# Patient Record
Sex: Male | Born: 1945 | Race: Black or African American | Hispanic: No | Marital: Single | Smoking: Never smoker
Health system: Southern US, Community
[De-identification: ages and names within clinical notes are randomized; demographics above are authoritative.]

## PROBLEM LIST (undated history)

## (undated) DIAGNOSIS — N183 Chronic kidney disease, stage 3 unspecified: Secondary | ICD-10-CM

## (undated) DIAGNOSIS — E7800 Pure hypercholesterolemia, unspecified: Secondary | ICD-10-CM

## (undated) DIAGNOSIS — I10 Essential (primary) hypertension: Secondary | ICD-10-CM

## (undated) DIAGNOSIS — K759 Inflammatory liver disease, unspecified: Secondary | ICD-10-CM

## (undated) DIAGNOSIS — E119 Type 2 diabetes mellitus without complications: Secondary | ICD-10-CM

## (undated) DIAGNOSIS — M1990 Unspecified osteoarthritis, unspecified site: Secondary | ICD-10-CM

## (undated) DIAGNOSIS — I214 Non-ST elevation (NSTEMI) myocardial infarction: Secondary | ICD-10-CM

## (undated) DIAGNOSIS — I2510 Atherosclerotic heart disease of native coronary artery without angina pectoris: Secondary | ICD-10-CM

## (undated) DIAGNOSIS — M109 Gout, unspecified: Secondary | ICD-10-CM

## (undated) DIAGNOSIS — E1169 Type 2 diabetes mellitus with other specified complication: Secondary | ICD-10-CM

## (undated) DIAGNOSIS — B1920 Unspecified viral hepatitis C without hepatic coma: Secondary | ICD-10-CM

## (undated) DIAGNOSIS — Z9289 Personal history of other medical treatment: Secondary | ICD-10-CM

## (undated) HISTORY — DX: Non-ST elevation (NSTEMI) myocardial infarction: I21.4

## (undated) HISTORY — PX: OTHER SURGICAL HISTORY: SHX169

## (undated) HISTORY — PX: TRANSURETHRAL RESECTION OF PROSTATE: SHX73

## (undated) HISTORY — DX: Unspecified viral hepatitis C without hepatic coma: B19.20

## (undated) HISTORY — DX: Atherosclerotic heart disease of native coronary artery without angina pectoris: I25.10

## (undated) HISTORY — PX: LEFT HEART CATH: SHX5946

## (undated) HISTORY — DX: Personal history of other medical treatment: Z92.89

## (undated) HISTORY — DX: Type 2 diabetes mellitus with other specified complication: E11.69

---

## 1997-12-18 ENCOUNTER — Other Ambulatory Visit: Admission: RE | Admit: 1997-12-18 | Discharge: 1997-12-18 | Payer: Self-pay | Admitting: Family Medicine

## 2007-11-02 ENCOUNTER — Emergency Department (HOSPITAL_COMMUNITY): Admission: EM | Admit: 2007-11-02 | Discharge: 2007-11-03 | Payer: Self-pay | Admitting: Emergency Medicine

## 2011-04-11 ENCOUNTER — Other Ambulatory Visit (HOSPITAL_COMMUNITY): Payer: Self-pay | Admitting: Internal Medicine

## 2011-04-11 DIAGNOSIS — R945 Abnormal results of liver function studies: Secondary | ICD-10-CM

## 2011-04-13 ENCOUNTER — Ambulatory Visit (HOSPITAL_COMMUNITY)
Admission: RE | Admit: 2011-04-13 | Discharge: 2011-04-13 | Disposition: A | Discharge status: Home / Self Care | Payer: Medicare Other | Source: Ambulatory Visit | Attending: Internal Medicine | Admitting: Internal Medicine

## 2011-04-13 DIAGNOSIS — R945 Abnormal results of liver function studies: Secondary | ICD-10-CM | POA: Insufficient documentation

## 2011-04-13 DIAGNOSIS — I10 Essential (primary) hypertension: Secondary | ICD-10-CM | POA: Insufficient documentation

## 2011-04-13 DIAGNOSIS — K7689 Other specified diseases of liver: Secondary | ICD-10-CM | POA: Insufficient documentation

## 2011-04-13 DIAGNOSIS — Q619 Cystic kidney disease, unspecified: Secondary | ICD-10-CM | POA: Insufficient documentation

## 2012-06-30 DIAGNOSIS — I214 Non-ST elevation (NSTEMI) myocardial infarction: Secondary | ICD-10-CM

## 2012-06-30 HISTORY — PX: TRANSTHORACIC ECHOCARDIOGRAM: SHX275

## 2012-06-30 HISTORY — DX: Non-ST elevation (NSTEMI) myocardial infarction: I21.4

## 2012-07-06 ENCOUNTER — Encounter (HOSPITAL_COMMUNITY): Payer: Self-pay | Admitting: *Deleted

## 2012-07-06 DIAGNOSIS — E119 Type 2 diabetes mellitus without complications: Secondary | ICD-10-CM | POA: Diagnosis present

## 2012-07-06 DIAGNOSIS — Z91199 Patient's noncompliance with other medical treatment and regimen due to unspecified reason: Secondary | ICD-10-CM

## 2012-07-06 DIAGNOSIS — I214 Non-ST elevation (NSTEMI) myocardial infarction: Principal | ICD-10-CM | POA: Diagnosis present

## 2012-07-06 DIAGNOSIS — Z9119 Patient's noncompliance with other medical treatment and regimen: Secondary | ICD-10-CM

## 2012-07-06 DIAGNOSIS — I129 Hypertensive chronic kidney disease with stage 1 through stage 4 chronic kidney disease, or unspecified chronic kidney disease: Secondary | ICD-10-CM | POA: Diagnosis present

## 2012-07-06 DIAGNOSIS — I2510 Atherosclerotic heart disease of native coronary artery without angina pectoris: Secondary | ICD-10-CM | POA: Diagnosis present

## 2012-07-06 DIAGNOSIS — M109 Gout, unspecified: Secondary | ICD-10-CM | POA: Diagnosis present

## 2012-07-06 DIAGNOSIS — N183 Chronic kidney disease, stage 3 unspecified: Secondary | ICD-10-CM | POA: Diagnosis present

## 2012-07-06 DIAGNOSIS — E785 Hyperlipidemia, unspecified: Secondary | ICD-10-CM | POA: Diagnosis present

## 2012-07-06 LAB — GLUCOSE, CAPILLARY

## 2012-07-06 MED ORDER — NITROGLYCERIN 0.4 MG SL SUBL
0.4000 mg | SUBLINGUAL_TABLET | SUBLINGUAL | Status: DC | PRN
  Filled 2012-07-06: qty 25

## 2012-07-06 NOTE — ED Notes (Addendum)
Pt reports developing left sided chest pain/pressure approximately 20 minutes ago. States that pain radiates down left arm. Reports SOB, denies nausea, vomiting, or dizziness with the chest pain. Pt reports HTN, high cholesterol, DM II. Pt alert and oriented x 4, neuro intact. Pt reports no history of chest pain. Pt does have a history of acid reflux.  Pt took a baby asa prior to coming to the ER.

## 2012-07-06 NOTE — ED Notes (Signed)
CBG= 121

## 2012-07-07 ENCOUNTER — Inpatient Hospital Stay (HOSPITAL_COMMUNITY)
Admission: EM | Admit: 2012-07-07 | Discharge: 2012-07-09 | DRG: 282 | Disposition: A | Discharge status: Home / Self Care | Payer: Medicare Other | Source: Ambulatory Visit | Attending: Internal Medicine | Admitting: Internal Medicine

## 2012-07-07 ENCOUNTER — Emergency Department (HOSPITAL_COMMUNITY): Payer: Medicare Other

## 2012-07-07 ENCOUNTER — Encounter (HOSPITAL_COMMUNITY): Payer: Self-pay | Admitting: Internal Medicine

## 2012-07-07 DIAGNOSIS — R079 Chest pain, unspecified: Secondary | ICD-10-CM

## 2012-07-07 DIAGNOSIS — I2510 Atherosclerotic heart disease of native coronary artery without angina pectoris: Secondary | ICD-10-CM

## 2012-07-07 DIAGNOSIS — E785 Hyperlipidemia, unspecified: Secondary | ICD-10-CM | POA: Diagnosis present

## 2012-07-07 DIAGNOSIS — M109 Gout, unspecified: Secondary | ICD-10-CM | POA: Diagnosis present

## 2012-07-07 DIAGNOSIS — E7800 Pure hypercholesterolemia, unspecified: Secondary | ICD-10-CM

## 2012-07-07 DIAGNOSIS — I214 Non-ST elevation (NSTEMI) myocardial infarction: Secondary | ICD-10-CM

## 2012-07-07 DIAGNOSIS — E119 Type 2 diabetes mellitus without complications: Secondary | ICD-10-CM

## 2012-07-07 DIAGNOSIS — I10 Essential (primary) hypertension: Secondary | ICD-10-CM

## 2012-07-07 DIAGNOSIS — R509 Fever, unspecified: Secondary | ICD-10-CM | POA: Diagnosis not present

## 2012-07-07 DIAGNOSIS — I517 Cardiomegaly: Secondary | ICD-10-CM

## 2012-07-07 DIAGNOSIS — N183 Chronic kidney disease, stage 3 unspecified: Secondary | ICD-10-CM | POA: Diagnosis present

## 2012-07-07 DIAGNOSIS — I219 Acute myocardial infarction, unspecified: Secondary | ICD-10-CM

## 2012-07-07 HISTORY — DX: Pure hypercholesterolemia, unspecified: E78.00

## 2012-07-07 HISTORY — DX: Gout, unspecified: M10.9

## 2012-07-07 HISTORY — DX: Chronic kidney disease, stage 3 (moderate): N18.3

## 2012-07-07 HISTORY — DX: Essential (primary) hypertension: I10

## 2012-07-07 HISTORY — DX: Type 2 diabetes mellitus without complications: E11.9

## 2012-07-07 HISTORY — DX: Chronic kidney disease, stage 3 unspecified: N18.30

## 2012-07-07 LAB — BASIC METABOLIC PANEL
BUN: 17 mg/dL (ref 6–23)
CO2: 26 mEq/L (ref 19–32)
Calcium: 9.8 mg/dL (ref 8.4–10.5)
Chloride: 102 mEq/L (ref 96–112)
Creatinine, Ser: 1.6 mg/dL — ABNORMAL HIGH (ref 0.50–1.35)
GFR calc Af Amer: 53 mL/min — ABNORMAL LOW (ref >90)
GFR calc non Af Amer: 46 mL/min — ABNORMAL LOW (ref >90)
Glucose, Bld: 114 mg/dL — ABNORMAL HIGH (ref 70–99)
Glucose, Bld: 96 mg/dL (ref 70–99)
Sodium: 140 mEq/L (ref 135–145)

## 2012-07-07 LAB — RAPID URINE DRUG SCREEN, HOSP PERFORMED
Amphetamines: NOT DETECTED
Benzodiazepines: NOT DETECTED
Cocaine: NOT DETECTED
Opiates: NOT DETECTED

## 2012-07-07 LAB — LIPID PANEL
Cholesterol: 209 mg/dL — ABNORMAL HIGH (ref 0–200)
HDL: 46 mg/dL (ref >39)
Total CHOL/HDL Ratio: 4.5 RATIO

## 2012-07-07 LAB — POCT I-STAT TROPONIN I

## 2012-07-07 LAB — TSH: TSH: 3.125 u[IU]/mL (ref 0.350–4.500)

## 2012-07-07 LAB — CBC
HCT: 41.5 % (ref 39.0–52.0)
HCT: 41.9 % (ref 39.0–52.0)
Hemoglobin: 14.7 g/dL (ref 13.0–17.0)
MCHC: 35.4 g/dL (ref 30.0–36.0)
MCV: 90.4 fL (ref 78.0–100.0)
MCV: 90.7 fL (ref 78.0–100.0)
Platelets: 141 10*3/uL — ABNORMAL LOW (ref 150–400)
Platelets: 145 10*3/uL — ABNORMAL LOW (ref 150–400)
RBC: 4.62 MIL/uL (ref 4.22–5.81)
RDW: 13 % (ref 11.5–15.5)
WBC: 8.2 10*3/uL (ref 4.0–10.5)

## 2012-07-07 LAB — PROTIME-INR: Prothrombin Time: 12.8 seconds (ref 11.6–15.2)

## 2012-07-07 MED ORDER — AMLODIPINE BESYLATE 5 MG PO TABS
5.0000 mg | ORAL_TABLET | Freq: Every day | ORAL | Status: DC
  Administered 2012-07-07 – 2012-07-08 (×2): 5 mg via ORAL
  Filled 2012-07-07 (×3): qty 1

## 2012-07-07 MED ORDER — METOPROLOL TARTRATE 12.5 MG HALF TABLET
12.5000 mg | ORAL_TABLET | Freq: Four times a day (QID) | ORAL | Status: DC
  Administered 2012-07-07 – 2012-07-09 (×5): 12.5 mg via ORAL
  Filled 2012-07-07 (×12): qty 1

## 2012-07-07 MED ORDER — SODIUM CHLORIDE 0.9 % IV SOLN: INTRAVENOUS | Status: DC

## 2012-07-07 MED ORDER — ASPIRIN EC 81 MG PO TBEC
81.0000 mg | DELAYED_RELEASE_TABLET | Freq: Every day | ORAL | Status: DC
  Administered 2012-07-09: 81 mg via ORAL
  Filled 2012-07-07 (×2): qty 1

## 2012-07-07 MED ORDER — SODIUM CHLORIDE 0.9 % IV SOLN: 250.0000 mL | INTRAVENOUS | Status: DC | PRN

## 2012-07-07 MED ORDER — ASPIRIN 81 MG PO CHEW: 324.0000 mg | CHEWABLE_TABLET | ORAL | Status: AC

## 2012-07-07 MED ORDER — INSULIN ASPART 100 UNIT/ML ~~LOC~~ SOLN: 0.0000 [IU] | Freq: Three times a day (TID) | SUBCUTANEOUS | Status: DC

## 2012-07-07 MED ORDER — SODIUM CHLORIDE 0.9 % IV SOLN
1.0000 mL/kg/h | INTRAVENOUS | Status: DC
  Administered 2012-07-07 – 2012-07-08 (×2): 1 mL/kg/h via INTRAVENOUS

## 2012-07-07 MED ORDER — METOPROLOL TARTRATE 12.5 MG HALF TABLET
12.5000 mg | ORAL_TABLET | Freq: Four times a day (QID) | ORAL | Status: DC
  Administered 2012-07-07 (×2): 12.5 mg via ORAL
  Filled 2012-07-07 (×5): qty 1

## 2012-07-07 MED ORDER — SODIUM CHLORIDE 0.9 % IJ SOLN
3.0000 mL | Freq: Two times a day (BID) | INTRAMUSCULAR | Status: DC
  Administered 2012-07-07 – 2012-07-08 (×2): 3 mL via INTRAVENOUS

## 2012-07-07 MED ORDER — CLOPIDOGREL BISULFATE 75 MG PO TABS
75.0000 mg | ORAL_TABLET | Freq: Every day | ORAL | Status: DC
  Administered 2012-07-08 – 2012-07-09 (×2): 75 mg via ORAL
  Filled 2012-07-07 (×3): qty 1

## 2012-07-07 MED ORDER — ACETAMINOPHEN 325 MG PO TABS
650.0000 mg | ORAL_TABLET | ORAL | Status: DC | PRN
  Administered 2012-07-08 – 2012-07-09 (×5): 650 mg via ORAL
  Filled 2012-07-07 (×5): qty 2

## 2012-07-07 MED ORDER — SODIUM CHLORIDE 0.9 % IV SOLN: INTRAVENOUS | Status: AC

## 2012-07-07 MED ORDER — SODIUM CHLORIDE 0.9 % IJ SOLN: 3.0000 mL | INTRAMUSCULAR | Status: DC | PRN

## 2012-07-07 MED ORDER — CLOPIDOGREL BISULFATE 300 MG PO TABS
600.0000 mg | ORAL_TABLET | Freq: Once | ORAL | Status: AC
  Administered 2012-07-07: 600 mg via ORAL
  Filled 2012-07-07: qty 2

## 2012-07-07 MED ORDER — METOPROLOL TARTRATE 12.5 MG HALF TABLET
12.5000 mg | ORAL_TABLET | Freq: Two times a day (BID) | ORAL | Status: DC
  Administered 2012-07-07: 12.5 mg via ORAL
  Filled 2012-07-07 (×3): qty 1

## 2012-07-07 MED ORDER — HEPARIN BOLUS VIA INFUSION
4000.0000 [IU] | Freq: Once | INTRAVENOUS | Status: AC
  Administered 2012-07-07: 4000 [IU] via INTRAVENOUS

## 2012-07-07 MED ORDER — HEPARIN (PORCINE) IN NACL 100-0.45 UNIT/ML-% IJ SOLN
1200.0000 [IU]/h | INTRAMUSCULAR | Status: DC
  Administered 2012-07-07: 1200 [IU]/h via INTRAVENOUS
  Administered 2012-07-07: 1300 [IU]/h via INTRAVENOUS
  Administered 2012-07-08: 1200 [IU]/h via INTRAVENOUS
  Filled 2012-07-07 (×4): qty 250

## 2012-07-07 MED ORDER — ONDANSETRON HCL 4 MG/2ML IJ SOLN: 4.0000 mg | Freq: Four times a day (QID) | INTRAMUSCULAR | Status: DC | PRN

## 2012-07-07 MED ORDER — ASPIRIN 81 MG PO CHEW
324.0000 mg | CHEWABLE_TABLET | Freq: Once | ORAL | Status: AC
  Administered 2012-07-07: 324 mg via ORAL
  Filled 2012-07-07: qty 4

## 2012-07-07 MED ORDER — ASPIRIN 300 MG RE SUPP
300.0000 mg | RECTAL | Status: AC
  Filled 2012-07-07: qty 1

## 2012-07-07 MED ORDER — ATORVASTATIN CALCIUM 80 MG PO TABS
80.0000 mg | ORAL_TABLET | Freq: Every day | ORAL | Status: DC
  Administered 2012-07-07 – 2012-07-08 (×3): 80 mg via ORAL
  Filled 2012-07-07 (×4): qty 1

## 2012-07-07 MED ORDER — ASPIRIN 81 MG PO CHEW
324.0000 mg | CHEWABLE_TABLET | ORAL | Status: AC
  Administered 2012-07-08: 324 mg via ORAL
  Filled 2012-07-07: qty 4

## 2012-07-07 NOTE — ED Notes (Signed)
Miller MD at bedside. 

## 2012-07-07 NOTE — H&P (Signed)
Cardiology History and Physical  No primary provider on file.  History of Present Illness (and review of medical records): Jesse Petersen is a 66 y.o. male who presents for evaluation of chest pain.  He has hx of remote cocaine use, HTN, HLD, DM, noncompliant with therapy, with no hx of MI or known CAD.  He developed intermittent chest pressure on Thursday at rest.  Pain was left sided, 10/10 with radiation to left arm associated with shortness of breath.  Pain would last 10-34minutes.  Pain persisted on Friday and Saturday.  He presented to ED as suggested by family.  His POC trop was .18.  He was given ASA, Nitro and Heparin.  He is currently chest pain free and hemodynamically stable.  Previous diagnostic testing for coronary artery disease includes: none. Previous history of cardiac disease includes None. Coronary artery disease risk factors include: advanced age (older than 10 for men, 18 for women), diabetes mellitus, dyslipidemia, hypertension and male gender. Patient denies history of arrhythmia, coronary artery disease, previous M.I. and valvular disease.  Review of Systems Further review of systems was otherwise negative other than stated in HPI.  There are no active problems to display for this patient.  Past Medical History  Diagnosis Date  . Diabetes mellitus without complication   . Hypertension   . High cholesterol     History reviewed. No pertinent past surgical history.   (Not in a hospital admission) No Known Allergies  History  Substance Use Topics  . Smoking status: Not on file  . Smokeless tobacco: Not on file  . Alcohol Use:     No family history on file.   Objective: Patient Vitals for the past 8 hrs:  BP Temp Temp src Pulse Resp SpO2 Height Weight  07/07/12 0215 150/95 mmHg - - 63  19  98 % - -  07/07/12 0200 153/98 mmHg - - 63  18  98 % - -  07/07/12 0115 157/99 mmHg - - 68  15  98 % - -  07/07/12 0045 148/98 mmHg - - 74  23  98 % - -  07/07/12 0030  138/96 mmHg - - 73  17  100 % - -  07/06/12 2342 161/99 mmHg 99 F (37.2 C) Oral 75  16  96 % 6' 1.5" (1.867 m) 110.678 kg (244 lb)   General Appearance:    Alert, cooperative, no distress, appears stated age  Head:    Normocephalic, without obvious abnormality, atraumatic  Eyes:     PERRL, EOMI, anicteric sclerae  Neck:   Supple, no carotid bruit or JVD  Lungs:     Clear to auscultation bilaterally, respirations unlabored  Heart:    Regular rate and rhythm, S1 and S2 normal, no murmur  Abdomen:     Soft, non-tender, normoactive bowel sounds  Extremities:   Extremities normal, atraumatic, no cyanosis or edema  Pulses:   2+ and symmetric all extremities  Skin:   no rashes or lesions  Neurologic:   No focal deficits. AAO x3   Results for orders placed during the hospital encounter of 07/07/12 (from the past 48 hour(s))  CBC     Status: Abnormal   Collection Time   07/06/12 11:48 PM      Component Value Range Comment   WBC 8.2  4.0 - 10.5 K/uL    RBC 4.62  4.22 - 5.81 MIL/uL    Hemoglobin 14.7  13.0 - 17.0 g/dL    HCT 41.9  39.0 - 52.0 %    MCV 90.7  78.0 - 100.0 fL    MCH 31.8  26.0 - 34.0 pg    MCHC 35.1  30.0 - 36.0 g/dL    RDW 21.3  08.6 - 57.8 %    Platelets 141 (*) 150 - 400 K/uL   BASIC METABOLIC PANEL     Status: Abnormal   Collection Time   07/06/12 11:48 PM      Component Value Range Comment   Sodium 139  135 - 145 mEq/L    Potassium 3.8  3.5 - 5.1 mEq/L    Chloride 102  96 - 112 mEq/L    CO2 26  19 - 32 mEq/L    Glucose, Bld 114 (*) 70 - 99 mg/dL    BUN 18  6 - 23 mg/dL    Creatinine, Ser 4.69 (*) 0.50 - 1.35 mg/dL    Calcium 9.8  8.4 - 62.9 mg/dL    GFR calc non Af Amer 43 (*) >90 mL/min    GFR calc Af Amer 50 (*) >90 mL/min   PROTIME-INR     Status: Normal   Collection Time   07/06/12 11:48 PM      Component Value Range Comment   Prothrombin Time 12.8  11.6 - 15.2 seconds    INR 0.97  0.00 - 1.49   GLUCOSE, CAPILLARY     Status: Abnormal   Collection Time    07/06/12 11:54 PM      Component Value Range Comment   Glucose-Capillary 121 (*) 70 - 99 mg/dL    Comment 1 Notify RN      Comment 2 Documented in Chart     POCT I-STAT TROPONIN I     Status: Abnormal   Collection Time   07/07/12 12:14 AM      Component Value Range Comment   Troponin i, poc 0.18 (*) 0.00 - 0.08 ng/mL    Comment NOTIFIED PHYSICIAN      Comment 3            TROPONIN I     Status: Abnormal   Collection Time   07/07/12  1:42 AM      Component Value Range Comment   Troponin I 0.57 (*) <0.30 ng/mL    Dg Chest Port 1 View  07/07/2012  *RADIOLOGY REPORT*  Clinical Data: Chest pain, shortness of breath.  PORTABLE CHEST - 1 VIEW  Comparison: None.  Findings: Heart and mediastinal contours are within normal limits. No focal opacities or effusions.  No acute bony abnormality.  IMPRESSION: No active cardiopulmonary disease.   Original Report Authenticated By: Charlett Nose, M.D.     ECG:  Sinus rhythm HR 80 no acute ischemic changes, none prior to compare  Assessment: 27M hx of HTN, HLD, DM presents with acute onset of chest pain along with elevated troponins suggestive of ACS/NSTEMI  Plan:  1. Admit to Cardiology, Stepdown Unit 2. Continuous monitoring on Telemetry. 3. Repeat ekg on admit, prn chest pain or arrythmia 4. Trend cardiac biomarkers, check lipids, hgba1c, tsh 5. Medical management to include ASA, Heparin gtt, Plavix load, BB, Statin, NTG prn 6. TTE in am assess LV function, wall motion 7. Will likely need further ischemic evaluation with cardiac catheterization which patient is agreeable to. 8. Full code.

## 2012-07-07 NOTE — Progress Notes (Signed)
  Echocardiogram 2D Echocardiogram has been performed.  Cathie Beams 07/07/2012, 9:17 AM

## 2012-07-07 NOTE — Progress Notes (Signed)
ANTICOAGULATION CONSULT NOTE - Follow Up Consult  Pharmacy Consult for heparin Indication: chest pain/ACS  No Known Allergies  Patient Measurements: Height: 6\' 1"  (185.4 cm) Weight: 239 lb 13.8 oz (108.8 kg) IBW/kg (Calculated) : 79.9    Vital Signs: Temp: 97.8 F (36.6 C) (12/08 0700) Temp src: Oral (12/08 0700) BP: 137/93 mmHg (12/08 0800) Pulse Rate: 57  (12/08 0800)  Labs:  Basename 07/07/12 0311 07/07/12 0142 07/06/12 2348  HGB -- -- 14.7  HCT -- -- 41.9  PLT -- -- 141*  APTT -- -- --  LABPROT -- -- 12.8  INR -- -- 0.97  HEPARINUNFRC -- -- --  CREATININE -- -- 1.60*  CKTOTAL -- -- --  CKMB -- -- --  TROPONINI 0.90* 0.57* --    Estimated Creatinine Clearance: 58.8 ml/min (by C-G formula based on Cr of 1.6).   Assessment: Patient is a 66 y.o M on heparin for NSTEMI.  First heparin level is slightly above goal at 0.73.  Goal of Therapy:  Heparin level 0.3-0.7 units/ml Monitor platelets by anticoagulation protocol: Yes   Plan:  1) decrease heparin drip to 1200 units/hr 2) check 6 hour heparin level  Tyrann Donaho P 07/07/2012,8:56 AM

## 2012-07-07 NOTE — Progress Notes (Signed)
ANTICOAGULATION CONSULT NOTE - Initial Consult  Pharmacy Consult for heparin Indication: chest pain/ACS  No Known Allergies  Patient Measurements: Height: 6' 1.5" (186.7 cm) Weight: 244 lb (110.678 kg) IBW/kg (Calculated) : 81.05  Heparin Dosing Weight: 105kg  Vital Signs: Temp: 97.9 F (36.6 C) (12/08 0322) Temp src: Oral (12/08 0322) BP: 150/95 mmHg (12/08 0215) Pulse Rate: 63  (12/08 0215)  Labs:  Basename 07/07/12 0142 07/06/12 2348  HGB -- 14.7  HCT -- 41.9  PLT -- 141*  APTT -- --  LABPROT -- 12.8  INR -- 0.97  HEPARINUNFRC -- --  CREATININE -- 1.60*  CKTOTAL -- --  CKMB -- --  TROPONINI 0.57* --    Estimated Creatinine Clearance: 59.7 ml/min (by C-G formula based on Cr of 1.6).   Medical History: Past Medical History  Diagnosis Date  . Diabetes mellitus without complication   . Hypertension   . High cholesterol     Medications:  Prescriptions prior to admission  Medication Sig Dispense Refill  . amLODipine-olmesartan (AZOR) 10-40 MG per tablet Take 1 tablet by mouth daily.      Marland Kitchen aspirin EC 81 MG tablet Take 81 mg by mouth daily.      . bethanechol (URECHOLINE) 10 MG tablet Take 10 mg by mouth 2 (two) times daily.      . colchicine 0.6 MG tablet Take 0.6 mg by mouth daily.      . pregabalin (LYRICA) 100 MG capsule Take 100 mg by mouth at bedtime as needed. For leg pain      . Tamsulosin HCl (FLOMAX) 0.4 MG CAPS Take 0.4 mg by mouth daily. Patient only takes as needed         Assessment: 66yo male c/o chest pressure associated with SOB and radiation to LUE, has h/o cocaine abuse and medical noncompliance, initial troponin elevated, to begin heparin.  Goal of Therapy:  Heparin level 0.3-0.7 units/ml Monitor platelets by anticoagulation protocol: Yes   Plan:  EDMD gave heparin 4000 units IV bolus x1 followed by gtt at 1300 units/hr; will continue gtt at current rate for now and monitor heparin levels and CBC.  Colleen Can PharmD  BCPS 07/07/2012,3:44 AM

## 2012-07-07 NOTE — Progress Notes (Signed)
Patient ID: Jesse Petersen, male   DOB: 05-20-1946, 66 y.o.   MRN: [id number]    SUBJECTIVE: No further chest pain since he left the ER.  No complaints this morning.      . [COMPLETED] aspirin  324 mg Oral Once  . aspirin  324 mg Oral NOW   Or  . aspirin  300 mg Rectal NOW  . aspirin EC  81 mg Oral Daily  . atorvastatin  80 mg Oral q1800  . [COMPLETED] clopidogrel  600 mg Oral Once  . clopidogrel  75 mg Oral Q breakfast  . [COMPLETED] heparin  4,000 Units Intravenous Once  . insulin aspart  0-9 Units Subcutaneous TID WC  . metoprolol tartrate  12.5 mg Oral BID      Filed Vitals:   07/07/12 0431 07/07/12 0500 07/07/12 0600 07/07/12 0700  BP:  146/94 145/95 148/90  Pulse:  62  63  Temp:      TempSrc:      Resp:  19  18  Height:      Weight:      SpO2: 97% 96%  98%    Intake/Output Summary (Last 24 hours) at 07/07/12 0739 Last data filed at 07/07/12 0700  Gross per 24 hour  Intake    132 ml  Output      0 ml  Net    132 ml    LABS: Basic Metabolic Panel:  Basename 07/06/12 2348  NA 139  K 3.8  CL 102  CO2 26  GLUCOSE 114*  BUN 18  CREATININE 1.60*  CALCIUM 9.8  MG --  PHOS --   Liver Function Tests: No results found for this basename: AST:2,ALT:2,ALKPHOS:2,BILITOT:2,PROT:2,ALBUMIN:2 in the last 72 hours No results found for this basename: LIPASE:2,AMYLASE:2 in the last 72 hours CBC:  Basename 07/06/12 2348  WBC 8.2  NEUTROABS --  HGB 14.7  HCT 41.9  MCV 90.7  PLT 141*   Cardiac Enzymes:  Basename 07/07/12 0311 07/07/12 0142  CKTOTAL -- --  CKMB -- --  CKMBINDEX -- --  TROPONINI 0.90* 0.57*   BNP: No components found with this basename: POCBNP:3 D-Dimer: No results found for this basename: DDIMER:2 in the last 72 hours Hemoglobin A1C: No results found for this basename: HGBA1C in the last 72 hours Fasting Lipid Panel: No results found for this basename: CHOL,HDL,LDLCALC,TRIG,CHOLHDL,LDLDIRECT in the last 72 hours Thyroid Function  Tests: No results found for this basename: TSH,T4TOTAL,FREET3,T3FREE,THYROIDAB in the last 72 hours Anemia Panel: No results found for this basename: VITAMINB12,FOLATE,FERRITIN,TIBC,IRON,RETICCTPCT in the last 72 hours  RADIOLOGY: Dg Chest Port 1 View  07/07/2012  *RADIOLOGY REPORT*  Clinical Data: Chest pain, shortness of breath.  PORTABLE CHEST - 1 VIEW  Comparison: None.  Findings: Heart and mediastinal contours are within normal limits. No focal opacities or effusions.  No acute bony abnormality.  IMPRESSION: No active cardiopulmonary disease.   Original Report Authenticated By: Charlett Nose, M.D.     PHYSICAL EXAM General: NAD Neck: No JVD, no thyromegaly or thyroid nodule.  Lungs: Clear to auscultation bilaterally with normal respiratory effort. CV: Nondisplaced PMI.  Heart regular S1/S2, +S4, no murmur.  No peripheral edema.  Normal pedal pulses.  Abdomen: Soft, nontender, no hepatosplenomegaly, no distention.  Neurologic: Alert and oriented x 3.  Psych: Normal affect. Extremities: No clubbing or cyanosis.   TELEMETRY: Reviewed telemetry pt in NSR  ASSESSMENT AND PLAN: 66 yo with h/o HTN presented with NSTEMI.  1. CAD: NSTEMI.  Loaded with Plavix last  night.  Continue ASA, heparin gtt, Plavix.  Increase metoprolol to every 6 hours.  Echo today.  2. HTN: Creatinine 1.6, suspect hypertensive nephropathy.  Will add amlodipine rather than ACEI for now.  3. CKD: Hydrate prior to cath.   Marca Ancona 07/07/2012 7:42 AM

## 2012-07-07 NOTE — Progress Notes (Signed)
ANTICOAGULATION CONSULT NOTE - Follow Up Consult  Pharmacy Consult for heparin Indication: chest pain/ACS  No Known Allergies  Patient Measurements: Height: 6\' 1"  (185.4 cm) Weight: 239 lb 13.8 oz (108.8 kg) IBW/kg (Calculated) : 79.9    Vital Signs: Temp: 97.8 F (36.6 C) (12/08 0700) Temp src: Oral (12/08 0700) BP: 131/103 mmHg (12/08 1610) Pulse Rate: 66  (12/08 1200)  Labs:  Basename 07/07/12 1630 07/07/12 0834 07/07/12 0311 07/07/12 0142 07/06/12 2348  HGB -- 14.7 -- -- 14.7  HCT -- 41.5 -- -- 41.9  PLT -- 145* -- -- 141*  APTT -- -- -- -- --  LABPROT -- 14.3 -- -- 12.8  INR -- 1.13 -- -- 0.97  HEPARINUNFRC 0.70 0.73* -- -- --  CREATININE -- 1.52* -- -- 1.60*  CKTOTAL -- -- -- -- --  CKMB -- -- -- -- --  TROPONINI -- 1.48* 0.90* 0.57* --    Estimated Creatinine Clearance: 61.9 ml/min (by C-G formula based on Cr of 1.52).   Assessment: Patient is a 66 y.o M on heparin for NSTEMI.  Heparin level is 0.7 after rate decreased from 1300 to 1200 units/hr. HL at high end of tx goal. No bleeding reported.   Goal of Therapy:  Heparin level 0.3-0.7 units/ml Monitor platelets by anticoagulation protocol: Yes   Plan:  1) continue heparin at  1200 units/hr 2) daily HL and CBC while on heparin Herby Abraham, Pharm.D. [phone] 07/07/2012 6:08 PM

## 2012-07-07 NOTE — ED Provider Notes (Signed)
History     CSN: [id number]  Arrival date & time 07/06/12  2336   First MD Initiated Contact with Patient 07/07/12 0013      Chief Complaint  Patient presents with  . Chest Pain    (Consider location/radiation/quality/duration/timing/severity/associated sxs/prior treatment) HPI Comments: 66 year old male with a history of hypertension, high cholesterol, diabetes without the need for medications and a past history in the distant past of cocaine use. He states that over the last 3 days he has been having intermittent chest pain which is the left side of his chest, heaviness, left upper chest with radiation to the left shoulder and arm. During these episodes he get short of breath and sometimes he gets diaphoretic. They last 15-20 minutes and then they resolve. At this time he is chest pain-free however prior to his arrival this evening he had an episode of pain that lasted longer and was more severe than his other episodes. He denies orthopnea, swelling of the legs, cough, fever, back pain, nausea, headache, sore throat, diarrhea, dysuria. He has never had a stress test or seen a cardiologist.  Patient is a 66 y.o. male presenting with chest pain. The history is provided by the patient and the spouse.  Chest Pain     Past Medical History  Diagnosis Date  . Diabetes mellitus without complication   . Hypertension   . High cholesterol     History reviewed. No pertinent past surgical history.  No family history on file.  History  Substance Use Topics  . Smoking status: Not on file  . Smokeless tobacco: Not on file  . Alcohol Use:       Review of Systems  Cardiovascular: Positive for chest pain.  All other systems reviewed and are negative.    Allergies  Review of patient's allergies indicates no known allergies.  Home Medications   Current Outpatient Rx  Name  Route  Sig  Dispense  Refill  . ASPIRIN EC 81 MG PO TBEC   Oral   Take 81 mg by mouth daily.         .  BETHANECHOL CHLORIDE 10 MG PO TABS   Oral   Take 10 mg by mouth 2 (two) times daily.         . COLCHICINE 0.6 MG PO TABS   Oral   Take 0.6 mg by mouth daily.         Marland Kitchen PREGABALIN 100 MG PO CAPS   Oral   Take 100 mg by mouth at bedtime.         . TAMSULOSIN HCL 0.4 MG PO CAPS   Oral   Take 0.4 mg by mouth daily.           BP 161/99  Pulse 75  Temp 99 F (37.2 C) (Oral)  Resp 16  Ht 6' 1.5" (1.867 m)  Wt 244 lb (110.678 kg)  BMI 31.76 kg/m2  SpO2 96%  Physical Exam  Nursing note and vitals reviewed. Constitutional: He appears well-developed and well-nourished. No distress.  HENT:  Head: Normocephalic and atraumatic.  Mouth/Throat: Oropharynx is clear and moist. No oropharyngeal exudate.  Eyes: Conjunctivae normal and EOM are normal. Pupils are equal, round, and reactive to light. Right eye exhibits no discharge. Left eye exhibits no discharge. No scleral icterus.  Neck: Normal range of motion. Neck supple. No JVD present. No thyromegaly present.  Cardiovascular: Normal rate, regular rhythm, normal heart sounds and intact distal pulses.  Exam reveals no gallop and  no friction rub.   No murmur heard. Pulmonary/Chest: Effort normal and breath sounds normal. No respiratory distress. He has no wheezes. He has no rales.  Abdominal: Soft. Bowel sounds are normal. He exhibits no distension and no mass. There is no tenderness.  Musculoskeletal: Normal range of motion. He exhibits no edema and no tenderness.  Lymphadenopathy:    He has no cervical adenopathy.  Neurological: He is alert. Coordination normal.  Skin: Skin is warm and dry. No rash noted. No erythema.  Psychiatric: He has a normal mood and affect. His behavior is normal.    ED Course  Procedures (including critical care time)  Labs Reviewed  CBC - Abnormal; Notable for the following:    Platelets 141 (*)     All other components within normal limits  GLUCOSE, CAPILLARY - Abnormal; Notable for the  following:    Glucose-Capillary 121 (*)     All other components within normal limits  POCT I-STAT TROPONIN I - Abnormal; Notable for the following:    Troponin i, poc 0.18 (*)     All other components within normal limits  PROTIME-INR  BASIC METABOLIC PANEL   Dg Chest Port 1 View  07/07/2012  *RADIOLOGY REPORT*  Clinical Data: Chest pain, shortness of breath.  PORTABLE CHEST - 1 VIEW  Comparison: None.  Findings: Heart and mediastinal contours are within normal limits. No focal opacities or effusions.  No acute bony abnormality.  IMPRESSION: No active cardiopulmonary disease.   Original Report Authenticated By: Charlett Nose, M.D.      1. Non-ST elevation MI (NSTEMI)   2. Hypertension       MDM  At this time the patient does appear comfortable, he is hypertensive at 160/99, he does admit that he does not take his blood pressure medications regularly. His EKG shows a left axis deviation with a left anterior fascicular block, borderline first degree AV block and poor R wave progression but no signs of acute myocardial infarction. The patient will be placed on a cardiac monitor, supple no oxygen, aspirin, nitroglycerin, labs and anticipate admission to the hospital for evaluation of what is likely unstable angina by history.  ED ECG REPORT  I personally interpreted this EKG   Date: 07/07/2012   Rate: 80  Rhythm: normal sinus rhythm  QRS Axis: left  Intervals: normal  ST/T Wave abnormalities: normal  Conduction Disutrbances:none  Narrative Interpretation: Intraventricular conduction delay, poor R-wave progression  Old EKG Reviewed: none available   12:20 AM, troponin has resulted positive at 0.18. The patient will be started on intravenous heparin. He is chest pain-free at this time   Care was discussed with the cardiologist Dr. Terressa Koyanagi Patient will be admitted to the hospital, critical care provided  CRITICAL CARE Performed by: Vida Roller   Total critical care time:  30  Critical care time was exclusive of separately billable procedures and treating other patients.  Critical care was necessary to treat or prevent imminent or life-threatening deterioration.  Critical care was time spent personally by me on the following activities: development of treatment plan with patient and/or surrogate as well as nursing, discussions with consultants, evaluation of patient's response to treatment, examination of patient, obtaining history from patient or surrogate, ordering and performing treatments and interventions, ordering and review of laboratory studies, ordering and review of radiographic studies, pulse oximetry and re-evaluation of patient's condition.     Vida Roller, MD 07/07/12 [id number]

## 2012-07-08 ENCOUNTER — Inpatient Hospital Stay (HOSPITAL_COMMUNITY): Payer: Medicare Other

## 2012-07-08 ENCOUNTER — Encounter (HOSPITAL_COMMUNITY)
Admission: EM | Disposition: A | Discharge status: Home / Self Care | Payer: Self-pay | Source: Ambulatory Visit | Attending: Internal Medicine

## 2012-07-08 DIAGNOSIS — I2510 Atherosclerotic heart disease of native coronary artery without angina pectoris: Secondary | ICD-10-CM

## 2012-07-08 HISTORY — PX: LEFT HEART CATHETERIZATION WITH CORONARY ANGIOGRAM: SHX5451

## 2012-07-08 LAB — GLUCOSE, CAPILLARY
Glucose-Capillary: 81 mg/dL (ref 70–99)
Glucose-Capillary: 132 mg/dL — ABNORMAL HIGH (ref 70–99)

## 2012-07-08 LAB — CBC
HCT: 39.4 % (ref 39.0–52.0)
Hemoglobin: 13.7 g/dL (ref 13.0–17.0)
MCH: 31.6 pg (ref 26.0–34.0)
MCHC: 34.8 g/dL (ref 30.0–36.0)
MCHC: 34.9 g/dL (ref 30.0–36.0)
MCV: 90.2 fL (ref 78.0–100.0)
MCV: 90.4 fL (ref 78.0–100.0)
Platelets: 145 10*3/uL — ABNORMAL LOW (ref 150–400)
RDW: 12.9 % (ref 11.5–15.5)
RDW: 13 % (ref 11.5–15.5)
WBC: 7.6 10*3/uL (ref 4.0–10.5)

## 2012-07-08 LAB — URINALYSIS, ROUTINE W REFLEX MICROSCOPIC
Bilirubin Urine: NEGATIVE
Hgb urine dipstick: NEGATIVE
Nitrite: NEGATIVE
Protein, ur: NEGATIVE mg/dL
Urobilinogen, UA: 1 mg/dL (ref 0.0–1.0)

## 2012-07-08 LAB — BASIC METABOLIC PANEL
BUN: 16 mg/dL (ref 6–23)
CO2: 25 mEq/L (ref 19–32)
GFR calc non Af Amer: 47 mL/min — ABNORMAL LOW (ref >90)
Glucose, Bld: 97 mg/dL (ref 70–99)
Potassium: 4.1 mEq/L (ref 3.5–5.1)

## 2012-07-08 SURGERY — LEFT HEART CATHETERIZATION WITH CORONARY ANGIOGRAM
Anesthesia: LOCAL

## 2012-07-08 MED ORDER — VERAPAMIL HCL 2.5 MG/ML IV SOLN
INTRAVENOUS | Status: AC
  Filled 2012-07-08: qty 2

## 2012-07-08 MED ORDER — MIDAZOLAM HCL 2 MG/2ML IJ SOLN
INTRAMUSCULAR | Status: AC
  Filled 2012-07-08: qty 2

## 2012-07-08 MED ORDER — COLCHICINE 0.6 MG PO TABS
0.6000 mg | ORAL_TABLET | Freq: Every day | ORAL | Status: DC
  Administered 2012-07-08 – 2012-07-09 (×2): 0.6 mg via ORAL
  Filled 2012-07-08 (×2): qty 1

## 2012-07-08 MED ORDER — NITROGLYCERIN 0.2 MG/ML ON CALL CATH LAB
INTRAVENOUS | Status: AC
  Filled 2012-07-08: qty 1

## 2012-07-08 MED ORDER — HYDRALAZINE HCL 20 MG/ML IJ SOLN
INTRAMUSCULAR | Status: AC
  Filled 2012-07-08: qty 1

## 2012-07-08 MED ORDER — HEPARIN (PORCINE) IN NACL 2-0.9 UNIT/ML-% IJ SOLN
INTRAMUSCULAR | Status: AC
  Filled 2012-07-08: qty 1000

## 2012-07-08 MED ORDER — SODIUM CHLORIDE 0.9 % IV SOLN: INTRAVENOUS | Status: AC

## 2012-07-08 MED ORDER — HEPARIN SODIUM (PORCINE) 1000 UNIT/ML IJ SOLN
INTRAMUSCULAR | Status: AC
  Filled 2012-07-08: qty 1

## 2012-07-08 MED ORDER — FENTANYL CITRATE 0.05 MG/ML IJ SOLN
INTRAMUSCULAR | Status: AC
  Filled 2012-07-08: qty 2

## 2012-07-08 MED ORDER — LIDOCAINE HCL (PF) 1 % IJ SOLN
INTRAMUSCULAR | Status: AC
  Filled 2012-07-08: qty 30

## 2012-07-08 NOTE — H&P (View-Only) (Signed)
Patient ID: Jesse Petersen, male   DOB: 03/19/1946, 66 y.o.   MRN: [id number]    SUBJECTIVE: No further chest pain since he left the ER.  No complaints this morning.      . [COMPLETED] aspirin  324 mg Oral Once  . aspirin  324 mg Oral NOW   Or  . aspirin  300 mg Rectal NOW  . aspirin EC  81 mg Oral Daily  . atorvastatin  80 mg Oral q1800  . [COMPLETED] clopidogrel  600 mg Oral Once  . clopidogrel  75 mg Oral Q breakfast  . [COMPLETED] heparin  4,000 Units Intravenous Once  . insulin aspart  0-9 Units Subcutaneous TID WC  . metoprolol tartrate  12.5 mg Oral BID      Filed Vitals:   07/07/12 0431 07/07/12 0500 07/07/12 0600 07/07/12 0700  BP:  146/94 145/95 148/90  Pulse:  62  63  Temp:      TempSrc:      Resp:  19  18  Height:      Weight:      SpO2: 97% 96%  98%    Intake/Output Summary (Last 24 hours) at 07/07/12 0739 Last data filed at 07/07/12 0700  Gross per 24 hour  Intake    132 ml  Output      0 ml  Net    132 ml    LABS: Basic Metabolic Panel:  Basename 07/06/12 2348  NA 139  K 3.8  CL 102  CO2 26  GLUCOSE 114*  BUN 18  CREATININE 1.60*  CALCIUM 9.8  MG --  PHOS --   Liver Function Tests: No results found for this basename: AST:2,ALT:2,ALKPHOS:2,BILITOT:2,PROT:2,ALBUMIN:2 in the last 72 hours No results found for this basename: LIPASE:2,AMYLASE:2 in the last 72 hours CBC:  Basename 07/06/12 2348  WBC 8.2  NEUTROABS --  HGB 14.7  HCT 41.9  MCV 90.7  PLT 141*   Cardiac Enzymes:  Basename 07/07/12 0311 07/07/12 0142  CKTOTAL -- --  CKMB -- --  CKMBINDEX -- --  TROPONINI 0.90* 0.57*   BNP: No components found with this basename: POCBNP:3 D-Dimer: No results found for this basename: DDIMER:2 in the last 72 hours Hemoglobin A1C: No results found for this basename: HGBA1C in the last 72 hours Fasting Lipid Panel: No results found for this basename: CHOL,HDL,LDLCALC,TRIG,CHOLHDL,LDLDIRECT in the last 72 hours Thyroid Function  Tests: No results found for this basename: TSH,T4TOTAL,FREET3,T3FREE,THYROIDAB in the last 72 hours Anemia Panel: No results found for this basename: VITAMINB12,FOLATE,FERRITIN,TIBC,IRON,RETICCTPCT in the last 72 hours  RADIOLOGY: Dg Chest Port 1 View  07/07/2012  *RADIOLOGY REPORT*  Clinical Data: Chest pain, shortness of breath.  PORTABLE CHEST - 1 VIEW  Comparison: None.  Findings: Heart and mediastinal contours are within normal limits. No focal opacities or effusions.  No acute bony abnormality.  IMPRESSION: No active cardiopulmonary disease.   Original Report Authenticated By: Kevin Dover, M.D.     PHYSICAL EXAM General: NAD Neck: No JVD, no thyromegaly or thyroid nodule.  Lungs: Clear to auscultation bilaterally with normal respiratory effort. CV: Nondisplaced PMI.  Heart regular S1/S2, +S4, no murmur.  No peripheral edema.  Normal pedal pulses.  Abdomen: Soft, nontender, no hepatosplenomegaly, no distention.  Neurologic: Alert and oriented x 3.  Psych: Normal affect. Extremities: No clubbing or cyanosis.   TELEMETRY: Reviewed telemetry pt in NSR  ASSESSMENT AND PLAN: 66 yo with h/o HTN presented with NSTEMI.  1. CAD: NSTEMI.  Loaded with Plavix last   night.  Continue ASA, heparin gtt, Plavix.  Increase metoprolol to every 6 hours.  Echo today.  2. HTN: Creatinine 1.6, suspect hypertensive nephropathy.  Will add amlodipine rather than ACEI for now.  3. CKD: Hydrate prior to cath.   Jesse Petersen 07/07/2012 7:42 AM   

## 2012-07-08 NOTE — Interval H&P Note (Signed)
History and Physical Interval Note:  07/08/2012 2:23 PM  Jesse Petersen  has presented today for cardiac cath with the diagnosis of NSTEMI  The various methods of treatment have been discussed with the patient and family. After consideration of risks, benefits and other options for treatment, the patient has consented to  Procedure(s) (LRB) with comments: LEFT HEART CATHETERIZATION WITH CORONARY ANGIOGRAM (N/A) as a surgical intervention .  The patient's history has been reviewed, patient examined, no change in status, stable for surgery.  I have reviewed the patient's chart and labs.  Questions were answered to the patient's satisfaction.     MCALHANY,CHRISTOPHER

## 2012-07-08 NOTE — Progress Notes (Signed)
ANTICOAGULATION CONSULT NOTE - Follow Up Consult  Pharmacy Consult for heparin Indication: chest pain/ACS  Labs:  Basename 07/08/12 0200 07/08/12 0135 07/07/12 1630 07/07/12 0834 07/07/12 0311 07/07/12 0142 07/06/12 2348  HGB 13.7 -- -- 14.7 -- -- --  HCT 39.4 -- -- 41.5 -- -- 41.9  PLT 151 -- -- 145* -- -- 141*  APTT -- -- -- -- -- -- --  LABPROT -- -- -- 14.3 -- -- 12.8  INR -- -- -- 1.13 -- -- 0.97  HEPARINUNFRC -- 0.53 0.70 0.73* -- -- --  CREATININE -- -- -- 1.52* -- -- 1.60*  CKTOTAL -- -- -- -- -- -- --  CKMB -- -- -- -- -- -- --  TROPONINI -- -- -- 1.48* 0.90* 0.57* --    Assessment/Plan:  66yo male remains therapeutic on heparin.  Will continue gtt at current rate and continue to monitor.  Colleen Can PharmD BCPS 07/08/2012,2:58 AM

## 2012-07-08 NOTE — Progress Notes (Signed)
TR BAND REMOVAL  LOCATION:    right radial  DEFLATED PER PROTOCOL:    yes  TIME BAND OFF / DRESSING APPLIED:    1745   SITE UPON ARRIVAL:    Level 0  SITE AFTER BAND REMOVAL:    Level 0  REVERSE ALLEN'S TEST:     positive  CIRCULATION SENSATION AND MOVEMENT:    Within Normal Limits   yes  COMMENTS:   Tolerated procedure well

## 2012-07-08 NOTE — Care Management Note (Signed)
    Page 1 of 1   07/08/2012     10:19:48 AM   CARE MANAGEMENT NOTE 07/08/2012  Patient:  Jesse Petersen,Jesse Petersen   Account Number:  [id number]  Date Initiated:  07/08/2012  Documentation initiated by:  Junius Creamer  Subjective/Objective Assessment:   adm w mi     Action/Plan:   lives alone   Anticipated DC Date:     Anticipated DC Plan:  HOME/SELF CARE      DC Planning Services  CM consult      Choice offered to / List presented to:             Status of service:   Medicare Important Message given?   (If response is "NO", the following Medicare IM given date fields will be blank) Date Medicare IM given:   Date Additional Medicare IM given:    Discharge Disposition:  HOME/SELF CARE  Per UR Regulation:  Reviewed for med. necessity/level of care/duration of stay  If discussed at Long Length of Stay Meetings, dates discussed:    Comments:  12/9 10:15a debbie Mazi Brailsford rn,bsn [phone]

## 2012-07-08 NOTE — CV Procedure (Signed)
Cardiac Catheterization Operative Report  Jesse Petersen [id number] 12/9/20133:29 PM No primary provider on file.  Procedure Performed:  1. Left Heart Catheterization 2. Selective Coronary Angiography 3. Left ventricular pressures  Operator: Verne Carrow, MD  Arterial access site:  Right radial artery and right femoral artery.  Indication:  66 yo AAM with history of HTN, HLD, DM admitted with chest pain, elevated troponin c/w NSTEMI. No pain over last 36 hours.                              Procedure Details: The risks, benefits, complications, treatment options, and expected outcomes were discussed with the patient. The patient and/or family concurred with the proposed plan, giving informed consent. The patient was brought to the cath lab after IV hydration was begun and oral premedication was given. The patient was further sedated with Versed and Fentanyl. The right wrist was assessed with an Allens test which was positive. The right wrist was prepped and draped in a sterile fashion. 1% lidocaine was used for local anesthesia. Using the modified Seldinger access technique, a 5 French sheath was placed in the right radial artery. 3 mg Verapamil was given through the sheath. 5000 units IV heparin was given. We easily advanced a catheter and wire into the aortic arch but were unable to torque the catheters given tortuosity. We elected to gain access in the right femoral artery. The right groin had been prepped and draped in a sterile fashion. 1% lidocaine was used for local anesthesia. Using the modified Seldinger technique a 5 French sheath was placed in the right femoral artery. Standard diagnostic catheters were used to perform selective coronary angiography. A pigtail catheter was used to measure left ventricular pressures. The sheath was removed from the right radial artery and a Terumo hemostasis band was applied at the arteriotomy site on the right wrist. A Mynx closure device was  placed in the right femoral artery.    There were no immediate complications. The patient was taken to the recovery area in stable condition.   Hemodynamic Findings: Central aortic pressure: 167/102 Left ventricular pressure: 147/9/12  Angiographic Findings:  Left main: No obstructive disease.  Left Anterior Descending Artery: Large caliber vessel that courses to the apex. There is no obstructive disease noted. The diagonal is moderate in caliber and has a mid 30% stenosis.   Circumflex Artery:  Moderate caliber vessel with moderate caliber bifurcating first obtuse marginal branch. The inferior sub-branch has a 40-50% stenosis.   Right Coronary Artery: Large, dominant vessel with 30% mid stenosis, mild luminal irregularities in the proximal and distal vessel. The PDA is small in caliber and has mild plaque disease. The Posterolateral branch is large in caliber and has mild plaque disease. There is a very small caliber sub-branch off of the distal posterolateral branch with 99% stenosis (1.25-1.5 mm vessel). This is likely the culprit vessel, too small for PCI.   Left Ventricular Angiogram: Deferred.  Impression: 1. Coronary artery disease with severe stenosis in very small caliber sub-branch of right posterolateral branch (too small for PCI) 2. Moderate stenosis OM2 and mid RCA  Recommendations: Medical management. Would continue dual anti-platelet therapy with ASA and Plavix given his presentation as a NSTEMI with severe stenosis in very small caliber vessel which is not amenable to PCI. Continue statin/beta blocker. Consider long acting nitrates.        Complications:  None. The patient tolerated the procedure well.

## 2012-07-09 ENCOUNTER — Encounter (HOSPITAL_COMMUNITY): Payer: Self-pay | Admitting: Physician Assistant

## 2012-07-09 DIAGNOSIS — E119 Type 2 diabetes mellitus without complications: Secondary | ICD-10-CM | POA: Diagnosis present

## 2012-07-09 DIAGNOSIS — M109 Gout, unspecified: Secondary | ICD-10-CM | POA: Diagnosis present

## 2012-07-09 DIAGNOSIS — R509 Fever, unspecified: Secondary | ICD-10-CM | POA: Diagnosis not present

## 2012-07-09 DIAGNOSIS — E785 Hyperlipidemia, unspecified: Secondary | ICD-10-CM | POA: Diagnosis present

## 2012-07-09 DIAGNOSIS — I10 Essential (primary) hypertension: Secondary | ICD-10-CM | POA: Diagnosis present

## 2012-07-09 DIAGNOSIS — E7800 Pure hypercholesterolemia, unspecified: Secondary | ICD-10-CM | POA: Diagnosis present

## 2012-07-09 DIAGNOSIS — I214 Non-ST elevation (NSTEMI) myocardial infarction: Secondary | ICD-10-CM | POA: Diagnosis present

## 2012-07-09 LAB — BASIC METABOLIC PANEL
CO2: 26 mEq/L (ref 19–32)
Chloride: 100 mEq/L (ref 96–112)
Glucose, Bld: 111 mg/dL — ABNORMAL HIGH (ref 70–99)
Potassium: 4.3 mEq/L (ref 3.5–5.1)
Sodium: 135 mEq/L (ref 135–145)

## 2012-07-09 LAB — GLUCOSE, CAPILLARY
Glucose-Capillary: 212 mg/dL — ABNORMAL HIGH (ref 70–99)
Glucose-Capillary: 233 mg/dL — ABNORMAL HIGH (ref 70–99)

## 2012-07-09 MED ORDER — PREDNISONE 10 MG PO TABS
10.0000 mg | ORAL_TABLET | Freq: Every day | ORAL | Status: DC
  Administered 2012-07-09: 10 mg via ORAL
  Filled 2012-07-09 (×2): qty 1

## 2012-07-09 MED ORDER — CLOPIDOGREL BISULFATE 75 MG PO TABS: 75.0000 mg | ORAL_TABLET | Freq: Every day | ORAL | Status: DC

## 2012-07-09 MED ORDER — METOPROLOL TARTRATE 25 MG PO TABS: 25.0000 mg | ORAL_TABLET | Freq: Two times a day (BID) | ORAL | Status: DC

## 2012-07-09 MED ORDER — HEART ATTACK BOUNCING BOOK
Freq: Once | Status: AC
  Administered 2012-07-09: 13:00:00
  Filled 2012-07-09: qty 1

## 2012-07-09 MED ORDER — NITROGLYCERIN 0.4 MG SL SUBL: 0.4000 mg | SUBLINGUAL_TABLET | SUBLINGUAL | Status: DC | PRN

## 2012-07-09 MED ORDER — PREDNISONE 10 MG PO TABS: 10.0000 mg | ORAL_TABLET | Freq: Every day | ORAL | Status: DC

## 2012-07-09 MED ORDER — ATORVASTATIN CALCIUM 80 MG PO TABS: 80.0000 mg | ORAL_TABLET | Freq: Every day | ORAL | Status: DC

## 2012-07-09 MED ORDER — AMLODIPINE BESYLATE 10 MG PO TABS: 10.0000 mg | ORAL_TABLET | Freq: Every day | ORAL | Status: DC

## 2012-07-09 MED ORDER — AMLODIPINE BESYLATE 10 MG PO TABS
10.0000 mg | ORAL_TABLET | Freq: Every day | ORAL | Status: DC
  Administered 2012-07-09: 10 mg via ORAL
  Filled 2012-07-09: qty 1

## 2012-07-09 NOTE — Progress Notes (Signed)
SUBJECTIVE: No chest pain or SOB. Pt febrile overnight but has no c/o cough, dysuria, weakness, chills.   BP 127/81  Pulse 79  Temp 98.6 F (37 C) (Oral)  Resp 18  Ht 6\' 1"  (1.854 m)  Wt 239 lb 13.8 oz (108.8 kg)  BMI 31.65 kg/m2  SpO2 100%  Intake/Output Summary (Last 24 hours) at 07/09/12 0703 Last data filed at 07/08/12 1900  Gross per 24 hour  Intake 1145.6 ml  Output   1020 ml  Net  125.6 ml    PHYSICAL EXAM General: Well developed, well nourished, in no acute distress. Alert and oriented x 3.  Psych:  Good affect, responds appropriately Neck: No JVD. No masses noted.  Lungs: Clear bilaterally with no wheezes or rhonci noted.  Heart: RRR with no murmurs noted. Abdomen: Bowel sounds are present. Soft, non-tender.  Extremities: No lower extremity edema. Right groin cath site ok. Right wrist cath site ok. Left knee warm to touch.   LABS: Basic Metabolic Panel:  Basename 07/09/12 0430 07/08/12 1033 07/07/12 0834  NA 135 138 --  K 4.3 4.1 --  CL 100 103 --  CO2 26 25 --  GLUCOSE 111* 97 --  BUN 17 16 --  CREATININE 1.62* 1.49* --  CALCIUM 9.4 9.5 --  MG -- -- 2.1  PHOS -- -- --   CBC:  Basename 07/08/12 2201 07/08/12 0200  WBC 6.8 7.6  NEUTROABS -- --  HGB 14.5 13.7  HCT 41.5 39.4  MCV 90.4 90.2  PLT 145* 151   Cardiac Enzymes:  Basename 07/07/12 0834 07/07/12 0311 07/07/12 0142  CKTOTAL -- -- --  CKMB -- -- --  CKMBINDEX -- -- --  TROPONINI 1.48* 0.90* 0.57*   Fasting Lipid Panel:  Basename 07/07/12 0834  CHOL 209*  HDL 46  LDLCALC 147*  TRIG 79  CHOLHDL 4.5  LDLDIRECT --    Current Meds:    . amLODipine  5 mg Oral Daily  . aspirin EC  81 mg Oral Daily  . atorvastatin  80 mg Oral q1800  . clopidogrel  75 mg Oral Q breakfast  . colchicine  0.6 mg Oral Daily  . [COMPLETED] fentaNYL      . [COMPLETED] heparin      . [COMPLETED] heparin      . [COMPLETED] hydrALAZINE      . insulin aspart  0-9 Units Subcutaneous TID WC  .  [COMPLETED] lidocaine      . metoprolol tartrate  12.5 mg Oral Q6H  . [COMPLETED] midazolam      . [COMPLETED] midazolam      . [COMPLETED] nitroGLYCERIN      . [COMPLETED] verapamil      . [DISCONTINUED] sodium chloride  3 mL Intravenous Q12H     ASSESSMENT AND PLAN:  1. NSTEMI: Pt admitted with chest pain. Cardiac cath yesterday with severe disease in a very small sub-branch of the right posterolateral artery. This vessel is the likely culprit for his presentation with chest pain and elevated troponin but too small for PCI. Will plan to treat with ASA 81 mg po Qdaily and Clopidogrel 75 mg po Qdaily for at least one month but longer if he tolerates. Will continue beta blocker, statin.   2. HTN: Better controlled than on admission. He was on Azor at home. This was stopped here due to his renal insufficiency. He has been maintained on Norvasc. Will d/c home on Norvasc alone without the ARB component of Azor.  He was started on metoprolol here and will be discharged on Lopressor 25 mg po BID.   3. Chronic Kidney disease, stage 2: Stable post cath. Will need f/u with primary care doctor.   4. Fever: Afebrile this am. Last fever of 103.1 at 1am. CXR negative. No cough. U/A negative. No dysuria. Pt believes this to be c/w a gout flare in his left knee and is very typical of his gout flares. Will treat with prednisone 10 mg po x 1 this am and then give 4 day supply of Prednisone 10 mg po Qdaily. This is how his gout flares are treated at home.   5. Dispo:  D/C home today. He can f/u with Dr. Shirlee Latch in 2-3 weeks in the [place] office.   Jesse Petersen  12/10/20137:03 AM

## 2012-07-09 NOTE — Plan of Care (Signed)
Problem: Phase III Progression Outcomes Goal: ACE I or ARB if EF < 40% Outcome: Not Met (add Reason) Not ordred.

## 2012-07-09 NOTE — Progress Notes (Signed)
Patient continues to have elevated tempeture follow up labs cxr and urine done. I notified Dr.Hochrein patient and wife feels that gout is contributing factor to his "flair up" to left knee started 2 days ago and wants to be started on steroid because that is what he does at home. MD wants to wait until the morning to r/o anything infectious. The plan is to continue tylenol to help control fever until further evaluation in the morning. I updated patient and wife of plan.

## 2012-07-09 NOTE — Progress Notes (Signed)
[id number] Cardiac Rehab Pt sitting in chair states that he has been walking in room. I did not ambulate in hall due to left knee pain and it is swollen. He states that he is able  to walk well when he does not have gout. Completed Mi education with pt and lady in room with him. They voice understanding. Pt agrees to Outpt. CRP in [place], will send referral.

## 2012-07-09 NOTE — Discharge Summary (Signed)
See full note this am. cdm 

## 2012-07-09 NOTE — Discharge Summary (Signed)
CARDIOLOGY DISCHARGE SUMMARY   Patient ID: Jesse Petersen MRN: [id number] DOB/AGE: [birth year] 66 y.o.  Admit date: 07/07/2012 Discharge date: 07/09/2012  Primary Discharge Diagnosis:     *Non-ST elevation myocardial infarction (NSTEMI), initial care episode - medical therapy recommended  Secondary Discharge Diagnosis:  Diabetes mellitus without complication  Hypertension  High cholesterol  Fever in adult  Chronic kidney disease, stage III (moderate)  Gout - with player  Procedure Performed:  1. Left Heart Catheterization 2. Selective Coronary Angiography 3. Left ventricular pressures 4. 2-D echocardiogram  Hospital Course: Jesse Petersen is a 66 year old male with no previous history of coronary artery disease. He developed chest pain which was intermittent for 3 days. His initial troponin was elevated. He was started on aspirin nitroglycerin and heparin. He was admitted for further evaluation and treatment.  His cardiac enzymes elevated indicating a non-ST segment elevation MI. His symptoms were controlled on medical therapy. On 07/08/2012, he was taken to the cath lab. The full results are below that medical therapy was recommended.  He was started on a statin and beta blocker. He has a remote history of cocaine use but a drug screen was negative. His renal function was abnormal so his ARB was discontinued. He was continued on the Norvasc for blood pressure control. To minimize dye use, a left ventriculogram was not performed at cath. A 2-D echocardiogram was performed with results below. His EF is preserved. He was seen by cardiac rehabilitation and counseled on heart healthy lifestyle changes and exercise guidelines. He became febrile post-cath and blood cultures were done. They are currently incomplete but negative so far. He was having left knee pain consistent with a gout flare. He was started on a short course of oral steroids and his symptoms improved. He will continue his  colchicine as prior to admission. He had been on Urecholine prior to admission but this was discontinued because of the need to add a beta blocker to his medication regimen. He is to continue taking Flomax as prior to admission.  On 07/09/2012, Jesse Petersen was seen by Dr. Clifton Lotus. He was ambulating without chest pain or shortness of breath and if the pain had improved. Dr. Clifton Nicklas considered stable for discharge, to followup as an outpatient.  Labs:  Lab Results  Component Value Date   WBC 6.8 07/08/2012   HGB 14.5 07/08/2012   HCT 41.5 07/08/2012   MCV 90.4 07/08/2012   PLT 145* 07/08/2012     Lab 07/09/12 0430  NA 135  K 4.3  CL 100  CO2 26  BUN 17  CREATININE 1.62*  CALCIUM 9.4  PROT --  BILITOT --  ALKPHOS --  ALT --  AST --  GLUCOSE 111*    Basename 07/07/12 0834 07/07/12 0311 07/07/12 0142  CKTOTAL -- -- --  CKMB -- -- --  CKMBINDEX -- -- --  TROPONINI 1.48* 0.90* 0.57*   Lipid Panel     Component Value Date/Time   CHOL 209* 07/07/2012 0834   TRIG 79 07/07/2012 0834   HDL 46 07/07/2012 0834   CHOLHDL 4.5 07/07/2012 0834   VLDL 16 07/07/2012 0834   LDLCALC 147* 07/07/2012 0834    Pro B Natriuretic peptide (BNP)  Date/Time Value Range Status  07/07/2012  8:34 AM 258.2* 0 - 125 pg/mL Final    Basename 07/07/12 0834  INR 1.13   Drugs of Abuse     Component Value Date/Time   LABOPIA NONE DETECTED 07/07/2012 0417   COCAINSCRNUR NONE DETECTED 07/07/2012  0417   LABBENZ NONE DETECTED 07/07/2012 0417   AMPHETMU NONE DETECTED 07/07/2012 0417   THCU NONE DETECTED 07/07/2012 0417   LABBARB NONE DETECTED 07/07/2012 0417     Radiology: Dg Chest Port 1 View  07/08/2012  *RADIOLOGY REPORT*  Clinical Data: Fever  PORTABLE CHEST - 1 VIEW  Comparison: 07/07/2012  Findings: Shallow inspiration.  Mild atelectasis in the left lung base. The heart size and pulmonary vascularity are normal. The lungs appear clear and expanded without focal air space disease or consolidation. No  blunting of the costophrenic angles.  No pneumothorax.  No significant change since previous study.  IMPRESSION: No evidence of active pulmonary disease.   Original Report Authenticated By: Burman Nieves, M.D.    Dg Chest Port 1 View  07/07/2012  *RADIOLOGY REPORT*  Clinical Data: Chest pain, shortness of breath.  PORTABLE CHEST - 1 VIEW  Comparison: None.  Findings: Heart and mediastinal contours are within normal limits. No focal opacities or effusions.  No acute bony abnormality.  IMPRESSION: No active cardiopulmonary disease.   Original Report Authenticated By: Charlett Nose, M.D.     Cardiac Cath: 07/08/2012 Left main: No obstructive disease.  Left Anterior Descending Artery: Large caliber vessel that courses to the apex. There is no obstructive disease noted. The diagonal is moderate in caliber and has a mid 30% stenosis.  Circumflex Artery: Moderate caliber vessel with moderate caliber bifurcating first obtuse marginal branch. The inferior sub-branch has a 40-50% stenosis.  Right Coronary Artery: Large, dominant vessel with 30% mid stenosis, mild luminal irregularities in the proximal and distal vessel. The PDA is small in caliber and has mild plaque disease. The Posterolateral branch is large in caliber and has mild plaque disease. There is a very small caliber sub-branch off of the distal posterolateral branch with 99% stenosis (1.25-1.5 mm vessel). This is likely the culprit vessel, too small for PCI.  Impression:  1. Coronary artery disease with severe stenosis in very small caliber sub-branch of right posterolateral branch (too small for PCI)  2. Moderate stenosis OM2 and mid RCA  Recommendations: Medical management. Would continue dual anti-platelet therapy with ASA and Plavix given his presentation as a NSTEMI with severe stenosis in very small caliber vessel which is not amenable to PCI. Continue statin/beta blocker. Consider long acting nitrates.  EKG: 08-Jul-2012 06:44:18 [facility] ROUTINE RECORD Normal sinus rhythm Cannot rule out Inferior infarct , age undetermined Abnormal ECG No significant change since last tracing 71mm/s 33mm/mV 100Hz  8.0.1 12SL 241 HD CID: 0 Referred by: Confirmed By: Valera Castle MD Vent. rate 67 BPM PR interval 208 ms QRS duration 100 ms QT/QTc 368/388 ms P-R-T axes 55 -18 16  Echo: 07/07/2012 Study Conclusions - Left ventricle: The cavity size was normal. Wall thickness was increased in a pattern of mild LVH. Systolic function was normal. The estimated ejection fraction was in the range of 60% to 65%. Wall motion was normal; there were no regional wall motion abnormalities. Doppler parameters are consistent with abnormal left ventricular relaxation (grade 1 diastolic dysfunction). - Aortic valve: There was no stenosis. - Aorta: Mildly dilated aortic root. Aortic root dimension: 38mm (ED). - Mitral valve: Trivial regurgitation. - Left atrium: The atrium was mildly dilated. - Right ventricle: The cavity size was normal. Systolic function was normal. - Pulmonary arteries: PA peak pressure: 23mm Hg (S). - Inferior vena cava: The vessel was normal in size; the respirophasic diameter changes were in the normal range (= 50%); findings are  consistent with normal central venous pressure. Impressions: - Normal LV size and systolic function with mild LV hypertrophy. EF 60-65%. No wall motion abnormalities noted. Normal RV size and systolic function. No significant valvular abnormalities.   FOLLOW UP PLANS AND APPOINTMENTS No Known Allergies   Medication List     As of 07/09/2012  9:59 AM    STOP taking these medications         bethanechol 10 MG tablet   Commonly known as: URECHOLINE      TAKE these medications         amLODipine 10 MG tablet   Commonly known as: NORVASC   Take 1 tablet (10 mg total) by mouth daily.      amLODipine-olmesartan 10-40 MG per tablet   Commonly known as: AZOR   Take 1  tablet by mouth daily.      aspirin EC 81 MG tablet   Take 81 mg by mouth daily.      atorvastatin 80 MG tablet   Commonly known as: LIPITOR   Take 1 tablet (80 mg total) by mouth daily.      clopidogrel 75 MG tablet   Commonly known as: PLAVIX   Take 1 tablet (75 mg total) by mouth daily.      colchicine 0.6 MG tablet   Take 0.6 mg by mouth daily.      metoprolol tartrate 25 MG tablet   Commonly known as: LOPRESSOR   Take 1 tablet (25 mg total) by mouth 2 (two) times daily.      nitroGLYCERIN 0.4 MG SL tablet   Commonly known as: NITROSTAT   Place 1 tablet (0.4 mg total) under the tongue every 5 (five) minutes as needed for chest pain.      predniSONE 10 MG tablet   Commonly known as: DELTASONE   Take 1 tablet (10 mg total) by mouth daily with breakfast.      pregabalin 100 MG capsule   Commonly known as: LYRICA   Take 100 mg by mouth at bedtime as needed. For leg pain      Tamsulosin HCl 0.4 MG Caps   Commonly known as: FLOMAX   Take 0.4 mg by mouth daily. Patient only takes as needed          Discharge Orders    Future Appointments: Provider: Department: Dept Phone: Center:   07/29/2012 9:50 AM Beatrice Lecher, PA [facility] Main Office [place]) [phone] [id number]     Follow-up Information    Follow up with Tereso Newcomer, PA. On 07/29/2012. (See for Dr Shirlee Latch at 9:50 am)    Contact information:   [address] [place] [address] [phone]          BRING ALL MEDICATIONS WITH YOU TO FOLLOW UP APPOINTMENTS  Time spent with patient to include physician time: 38 min Signed: Theodore Demark 07/09/2012, 9:59 AM Co-Sign MD

## 2012-07-15 LAB — CULTURE, BLOOD (ROUTINE X 2)
Culture: NO GROWTH
Culture: NO GROWTH

## 2012-07-29 ENCOUNTER — Encounter: Payer: Medicare Other | Admitting: Physician Assistant

## 2012-08-21 ENCOUNTER — Ambulatory Visit (INDEPENDENT_AMBULATORY_CARE_PROVIDER_SITE_OTHER): Payer: Medicare Other | Admitting: Physician Assistant

## 2012-08-21 ENCOUNTER — Encounter: Payer: Self-pay | Admitting: Physician Assistant

## 2012-08-21 VITALS — BP 152/89 | HR 76 | Ht 73.0 in | Wt 239.8 lb

## 2012-08-21 DIAGNOSIS — E785 Hyperlipidemia, unspecified: Secondary | ICD-10-CM

## 2012-08-21 DIAGNOSIS — I10 Essential (primary) hypertension: Secondary | ICD-10-CM

## 2012-08-21 DIAGNOSIS — I2510 Atherosclerotic heart disease of native coronary artery without angina pectoris: Secondary | ICD-10-CM

## 2012-08-21 NOTE — Addendum Note (Signed)
Addended by: Tarri Fuller on: 08/21/2012 05:08 PM   Modules accepted: Orders

## 2012-08-21 NOTE — Progress Notes (Signed)
[address] [place], [place]  [address] Phone: [phone], Fax:  [phone]  Date:  08/21/2012   ID:  Jesse Petersen, DOB November 17, 1945, MRN [id number]  PCP:  Ralene Ok, MD  Primary Cardiologist:  Dr. Marca Ancona     History of Present Illness: Jesse Petersen is a 67 y.o. male who returns for follow up after recent admission for NSTEMI.  He has a hx of DM2, HTN, HL, CKD, remote cocaine use. He was admitted 12/8-12/10. He presented with chest pain. Cardiac markers are abnormal ruling him in for non-STEMI. LHC 07/08/12: mDx 30%, inferior subbranch of the CFX 40-50%, mRCA 30%, very small-caliber subbranch off the distal PL branch 99% (small)-culprit vessel-2 small for PCI. Medical therapy recommended. He was placed on aspirin Plavix. ARB component of his Azor was discontinued due to renal insufficiency. Patient was also treated with prednisone for gout flare with associated fever.  Echo 07/07/12:  Mild LVH, EF 60-65%, Gr 1 diast dysfn, mildly dilated Ao root (38 mm), Tr MR, mild LAE, PASP 23.    He is doing well.  The patient denies chest pain, shortness of breath, syncope, orthopnea, PND or significant pedal edema.   Labs (12/13):  K 4.3, creatinine 1.62, LDL 147, Hgb 14.5, TSH 3.125  Wt Readings from Last 3 Encounters:  08/21/12 239 lb 12.8 oz (108.773 kg)  07/07/12 239 lb 13.8 oz (108.8 kg)  07/07/12 239 lb 13.8 oz (108.8 kg)     Past Medical History  Diagnosis Date  . Diabetes mellitus without complication   . Hypertension   . High cholesterol   . Gout   . Chronic kidney disease, stage III (moderate)   . CAD (coronary artery disease)     a. NSTEMI 12/13:  LHC 07/08/12: mDx 30%, inferior subbranch of the CFX 40-50%, mRCA 30%, very small-caliber subbranch off the distal PL branch 99% (small)-culprit vessel-2 small for PCI. Medical therapy recommended  . Hx of echocardiogram     a. Echo 07/07/12:  Mild LVH, EF 60-65%, Gr 1 diast dysfn, mildly dilated Ao root  (38 mm), Tr MR, mild LAE, PASP 23.    Current Outpatient Prescriptions  Medication Sig Dispense Refill  . amLODipine (NORVASC) 10 MG tablet Take 1 tablet (10 mg total) by mouth daily.  30 tablet  11  . aspirin EC 81 MG tablet Take 81 mg by mouth daily.      Marland Kitchen atorvastatin (LIPITOR) 80 MG tablet Take 1 tablet (80 mg total) by mouth daily.  30 tablet  11  . clopidogrel (PLAVIX) 75 MG tablet Take 1 tablet (75 mg total) by mouth daily.  30 tablet  11  . colchicine 0.6 MG tablet Take 0.6 mg by mouth daily.      . metoprolol tartrate (LOPRESSOR) 25 MG tablet Take 1 tablet (25 mg total) by mouth 2 (two) times daily.  60 tablet  11  . multivitamin-iron-minerals-folic acid (CENTRUM) chewable tablet Chew 1 tablet by mouth daily.      . nitroGLYCERIN (NITROSTAT) 0.4 MG SL tablet Place 1 tablet (0.4 mg total) under the tongue every 5 (five) minutes as needed for chest pain.  25 tablet  3  . predniSONE (DELTASONE) 10 MG tablet Take 1 tablet (10 mg total) by mouth daily with breakfast.  5 tablet  0  . pregabalin (LYRICA) 100 MG capsule Take 100 mg by mouth at bedtime as needed. For leg pain      . Tamsulosin HCl (FLOMAX) 0.4 MG CAPS  Take 0.4 mg by mouth daily. Patient only takes as needed        Allergies:   No Known Allergies  Social History:  The patient  reports that he has never smoked. He has never used smokeless tobacco. He reports that he does not use illicit drugs.   ROS:  Please see the history of present illness.     All other systems reviewed and negative.   PHYSICAL EXAM: VS:  BP 152/89  Pulse 76  Ht 6\' 1"  (1.854 m)  Wt 239 lb 12.8 oz (108.773 kg)  BMI 31.64 kg/m2 Well nourished, well developed, in no acute distress HEENT: normal Neck: no JVD Cardiac:  normal S1, S2; RRR; no murmur Lungs:  clear to auscultation bilaterally, no wheezing, rhonchi or rales Abd: soft, nontender, no hepatomegaly Ext: no edema; right wrist without hematoma or bruit; right groin without hematoma or bruit    Skin: warm and dry Neuro:  CNs 2-12 intact, no focal abnormalities noted  EKG:  NSR, HR 79, inf Q waves, no change from prior  ASSESSMENT AND PLAN:  1. Coronary Artery Disease:  Doing well post NSTEMI 2/2 sub-total occlusion of very small sub-branch of PL.  Med Rx is to be continued.  Continue ASA, Plavix, beta blocker, statin.  I will refer him to cardiac rehab.  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  I explained to him that any further testing that is needed would be determined by his company (ie whether or not he needs a stress test, etc). 2. Hyperlipidemia:  Continue high dose Lipitor.  Check Lipids and LFTs in 6 weeks.   3. Hypertension:  He notes his BP is much better at home and states he has "white coat HTN."  Continue current Rx.  4. Disposition:  Follow up with Dr. Marca Ancona in 3 mos.   Signed, Tereso Newcomer, PA-C  4:12 PM 08/21/2012

## 2012-08-21 NOTE — Patient Instructions (Addendum)
NO MEDICATIONS CHANGES TODAY  You have been referred to CARDIAC REHAB DX 414.01, 401.1, 272.4 TO BE DONE AT [facility]  Your physician recommends that you return for lab work in: 6 WEEKS FOR FASTING LIPID AND LIVER PANEL DX 272.4  PLEASE FOLLOW UP WITH DR. Shirlee Latch IN ABOUT 3 MONTHS

## 2012-10-01 ENCOUNTER — Encounter: Payer: Self-pay | Admitting: Cardiology

## 2012-10-26 ENCOUNTER — Emergency Department (HOSPITAL_COMMUNITY)
Admission: EM | Admit: 2012-10-26 | Discharge: 2012-10-26 | Disposition: A | Discharge status: Home / Self Care | Payer: Medicare Other | Attending: Emergency Medicine | Admitting: Emergency Medicine

## 2012-10-26 ENCOUNTER — Encounter (HOSPITAL_COMMUNITY): Payer: Self-pay | Admitting: Cardiology

## 2012-10-26 DIAGNOSIS — Z7982 Long term (current) use of aspirin: Secondary | ICD-10-CM | POA: Insufficient documentation

## 2012-10-26 DIAGNOSIS — Z791 Long term (current) use of non-steroidal anti-inflammatories (NSAID): Secondary | ICD-10-CM | POA: Insufficient documentation

## 2012-10-26 DIAGNOSIS — M109 Gout, unspecified: Secondary | ICD-10-CM | POA: Insufficient documentation

## 2012-10-26 DIAGNOSIS — E7800 Pure hypercholesterolemia, unspecified: Secondary | ICD-10-CM | POA: Insufficient documentation

## 2012-10-26 DIAGNOSIS — Z7902 Long term (current) use of antithrombotics/antiplatelets: Secondary | ICD-10-CM | POA: Insufficient documentation

## 2012-10-26 DIAGNOSIS — Z79899 Other long term (current) drug therapy: Secondary | ICD-10-CM | POA: Insufficient documentation

## 2012-10-26 DIAGNOSIS — I129 Hypertensive chronic kidney disease with stage 1 through stage 4 chronic kidney disease, or unspecified chronic kidney disease: Secondary | ICD-10-CM | POA: Insufficient documentation

## 2012-10-26 DIAGNOSIS — I2510 Atherosclerotic heart disease of native coronary artery without angina pectoris: Secondary | ICD-10-CM | POA: Insufficient documentation

## 2012-10-26 DIAGNOSIS — N183 Chronic kidney disease, stage 3 unspecified: Secondary | ICD-10-CM | POA: Insufficient documentation

## 2012-10-26 DIAGNOSIS — E119 Type 2 diabetes mellitus without complications: Secondary | ICD-10-CM | POA: Insufficient documentation

## 2012-10-26 DIAGNOSIS — I252 Old myocardial infarction: Secondary | ICD-10-CM | POA: Insufficient documentation

## 2012-10-26 MED ORDER — OXYCODONE-ACETAMINOPHEN 5-325 MG PO TABS: ORAL_TABLET | ORAL | Status: DC

## 2012-10-26 MED ORDER — INDOMETHACIN 50 MG PO CAPS: 50.0000 mg | ORAL_CAPSULE | Freq: Three times a day (TID) | ORAL | Status: DC

## 2012-10-26 MED ORDER — INDOMETHACIN 25 MG PO CAPS
50.0000 mg | ORAL_CAPSULE | Freq: Once | ORAL | Status: AC
  Administered 2012-10-26: 50 mg via ORAL
  Filled 2012-10-26: qty 2

## 2012-10-26 MED ORDER — OXYCODONE-ACETAMINOPHEN 5-325 MG PO TABS
1.0000 | ORAL_TABLET | Freq: Once | ORAL | Status: AC
  Administered 2012-10-26: 1 via ORAL
  Filled 2012-10-26: qty 1

## 2012-10-26 NOTE — Progress Notes (Signed)
Orthopedic Tech Progress Note Patient Details:  Jesse Petersen 1945/08/11 [id number]  Ortho Devices Type of Ortho Device: Arm sling Ortho Device/Splint Location: right arm Ortho Device/Splint Interventions: Application   Jesse Petersen 10/26/2012, 3:01 PM

## 2012-10-26 NOTE — ED Notes (Signed)
Pt reports he was hitting his wife on the back to help her burp and started having pain in the right hand. Wrist is swollen but has hx of gout in the same.

## 2012-10-26 NOTE — ED Provider Notes (Signed)
History     CSN: [id number]  Arrival date & time 10/26/12  1311   First MD Initiated Contact with Patient 10/26/12 1340      Chief Complaint  Patient presents with  . Hand Pain    (Consider location/radiation/quality/duration/timing/severity/associated sxs/prior treatment) HPI  Jesse Petersen is a 67 y.o. male complaining of Right hand pain onset yesterday. Patient's wife was having very bad indigestion and she asked him to hit her on the back to help her belch as he was doing this the pain started increased since that time. Patient has had multiple episodes of gout in the right hand in the past. Pain is described as 7/10, increasing, exacerbated with movement and palpation, assistant with prior episodes of gout exacerbation. Denies numbness and paresthesia.  Past Medical History  Diagnosis Date  . Diabetes mellitus without complication   . Hypertension   . High cholesterol   . Gout   . Chronic kidney disease, stage III (moderate)   . CAD (coronary artery disease)     a. NSTEMI 12/13:  LHC 07/08/12: mDx 30%, inferior subbranch of the CFX 40-50%, mRCA 30%, very small-caliber subbranch off the distal PL branch 99% (small)-culprit vessel-2 small for PCI. Medical therapy recommended  . Hx of echocardiogram     a. Echo 07/07/12:  Mild LVH, EF 60-65%, Gr 1 diast dysfn, mildly dilated Ao root (38 mm), Tr MR, mild LAE, PASP 23.    History reviewed. No pertinent past surgical history.  History reviewed. No pertinent family history.  History  Substance Use Topics  . Smoking status: Never Smoker   . Smokeless tobacco: Never Used  . Alcohol Use: Not on file      Review of Systems  Constitutional: Negative for fever.  Respiratory: Negative for shortness of breath.   Cardiovascular: Negative for chest pain.  Gastrointestinal: Negative for nausea, vomiting, abdominal pain and diarrhea.  Musculoskeletal: Positive for arthralgias.  All other systems reviewed and are  negative.    Allergies  Review of patient's allergies indicates no known allergies.  Home Medications   Current Outpatient Rx  Name  Route  Sig  Dispense  Refill  . amLODipine (NORVASC) 10 MG tablet   Oral   Take 1 tablet (10 mg total) by mouth daily.   30 tablet   11   . aspirin EC 81 MG tablet   Oral   Take 81 mg by mouth daily.         Marland Kitchen atorvastatin (LIPITOR) 80 MG tablet   Oral   Take 1 tablet (80 mg total) by mouth daily.   30 tablet   11   . clopidogrel (PLAVIX) 75 MG tablet   Oral   Take 1 tablet (75 mg total) by mouth daily.   30 tablet   11   . colchicine 0.6 MG tablet   Oral   Take 0.6 mg by mouth daily.         . indomethacin (INDOCIN) 50 MG capsule   Oral   Take 1 capsule (50 mg total) by mouth 3 (three) times daily with meals.   15 capsule   0   . metoprolol tartrate (LOPRESSOR) 25 MG tablet   Oral   Take 1 tablet (25 mg total) by mouth 2 (two) times daily.   60 tablet   11   . multivitamin-iron-minerals-folic acid (CENTRUM) chewable tablet   Oral   Chew 1 tablet by mouth daily.         . nitroGLYCERIN (  NITROSTAT) 0.4 MG SL tablet   Sublingual   Place 1 tablet (0.4 mg total) under the tongue every 5 (five) minutes as needed for chest pain.   25 tablet   3   . oxyCODONE-acetaminophen (PERCOCET/ROXICET) 5-325 MG per tablet      1 to 2 tabs PO q6hrs  PRN for pain   15 tablet   0   . predniSONE (DELTASONE) 10 MG tablet   Oral   Take 1 tablet (10 mg total) by mouth daily with breakfast.   5 tablet   0   . pregabalin (LYRICA) 100 MG capsule   Oral   Take 100 mg by mouth at bedtime as needed. For leg pain         . Tamsulosin HCl (FLOMAX) 0.4 MG CAPS   Oral   Take 0.4 mg by mouth daily. Patient only takes as needed           BP 141/91  Pulse 97  Temp(Src) 99 F (37.2 C) (Oral)  Resp 18  SpO2 98%  Physical Exam  Nursing note and vitals reviewed. Constitutional: He is oriented to person, place, and time. He  appears well-developed and well-nourished. No distress.  HENT:  Head: Normocephalic.  Eyes: Conjunctivae and EOM are normal.  Cardiovascular: Normal rate.   Pulmonary/Chest: Effort normal. No stridor.  Musculoskeletal: Normal range of motion.  Diffuse swelling, mild erythema and warmth to the dorsum of right proximal metacarpal area good range of motion to the fingers and wrist. Neurovascularly intact.  Neurological: He is alert and oriented to person, place, and time.  Psychiatric: He has a normal mood and affect.    ED Course  Procedures (including critical care time)  Labs Reviewed - No data to display No results found.  Medications  oxyCODONE-acetaminophen (PERCOCET/ROXICET) 5-325 MG per tablet 1 tablet (1 tablet Oral Given 10/26/12 1501)  indomethacin (INDOCIN) capsule 50 mg (50 mg Oral Given 10/26/12 1501)    1. Gout flare       MDM   Jesse Petersen is a 67 y.o. male with right hand pain consistent with prior episodes of gout.   Filed Vitals:   10/26/12 1335  BP: 141/91  Pulse: 97  Temp: 99 F (37.2 C)  TempSrc: Oral  Resp: 18  SpO2: 98%     Pt verbalized understanding and agrees with care plan. Outpatient follow-up and return precautions given.    Discharge Medication List as of 10/26/2012  2:36 PM    START taking these medications   Details  indomethacin (INDOCIN) 50 MG capsule Take 1 capsule (50 mg total) by mouth 3 (three) times daily with meals., Starting 10/26/2012, Until Discontinued, Print    oxyCODONE-acetaminophen (PERCOCET/ROXICET) 5-325 MG per tablet 1 to 2 tabs PO q6hrs  PRN for pain, Print               Jesse Emery, PA-C 10/27/12 1118

## 2012-10-27 NOTE — ED Provider Notes (Signed)
Medical screening examination/treatment/procedure(s) were performed by non-physician practitioner and as supervising physician I was immediately available for consultation/collaboration.    Vida Roller, MD 10/27/12 [phone]

## 2012-11-11 ENCOUNTER — Encounter (HOSPITAL_COMMUNITY): Payer: Self-pay | Admitting: Emergency Medicine

## 2012-11-11 ENCOUNTER — Emergency Department (HOSPITAL_COMMUNITY)
Admission: EM | Admit: 2012-11-11 | Discharge: 2012-11-11 | Disposition: A | Discharge status: Home / Self Care | Payer: Medicare Other | Attending: Emergency Medicine | Admitting: Emergency Medicine

## 2012-11-11 DIAGNOSIS — N183 Chronic kidney disease, stage 3 unspecified: Secondary | ICD-10-CM | POA: Insufficient documentation

## 2012-11-11 DIAGNOSIS — R51 Headache: Secondary | ICD-10-CM | POA: Insufficient documentation

## 2012-11-11 DIAGNOSIS — Z79899 Other long term (current) drug therapy: Secondary | ICD-10-CM | POA: Insufficient documentation

## 2012-11-11 DIAGNOSIS — I129 Hypertensive chronic kidney disease with stage 1 through stage 4 chronic kidney disease, or unspecified chronic kidney disease: Secondary | ICD-10-CM | POA: Insufficient documentation

## 2012-11-11 DIAGNOSIS — R05 Cough: Secondary | ICD-10-CM | POA: Insufficient documentation

## 2012-11-11 DIAGNOSIS — Z7982 Long term (current) use of aspirin: Secondary | ICD-10-CM | POA: Insufficient documentation

## 2012-11-11 DIAGNOSIS — R0982 Postnasal drip: Secondary | ICD-10-CM | POA: Insufficient documentation

## 2012-11-11 DIAGNOSIS — J0110 Acute frontal sinusitis, unspecified: Secondary | ICD-10-CM | POA: Insufficient documentation

## 2012-11-11 DIAGNOSIS — E119 Type 2 diabetes mellitus without complications: Secondary | ICD-10-CM | POA: Insufficient documentation

## 2012-11-11 DIAGNOSIS — Z7902 Long term (current) use of antithrombotics/antiplatelets: Secondary | ICD-10-CM | POA: Insufficient documentation

## 2012-11-11 DIAGNOSIS — J3489 Other specified disorders of nose and nasal sinuses: Secondary | ICD-10-CM | POA: Insufficient documentation

## 2012-11-11 DIAGNOSIS — R6889 Other general symptoms and signs: Secondary | ICD-10-CM | POA: Insufficient documentation

## 2012-11-11 DIAGNOSIS — R062 Wheezing: Secondary | ICD-10-CM | POA: Insufficient documentation

## 2012-11-11 DIAGNOSIS — I2510 Atherosclerotic heart disease of native coronary artery without angina pectoris: Secondary | ICD-10-CM | POA: Insufficient documentation

## 2012-11-11 DIAGNOSIS — R059 Cough, unspecified: Secondary | ICD-10-CM | POA: Insufficient documentation

## 2012-11-11 DIAGNOSIS — M109 Gout, unspecified: Secondary | ICD-10-CM | POA: Insufficient documentation

## 2012-11-11 DIAGNOSIS — E7800 Pure hypercholesterolemia, unspecified: Secondary | ICD-10-CM | POA: Insufficient documentation

## 2012-11-11 MED ORDER — AMOXICILLIN-POT CLAVULANATE 250-62.5 MG/5ML PO SUSR: 250.0000 mg | Freq: Three times a day (TID) | ORAL | Status: AC

## 2012-11-11 MED ORDER — OXYCODONE-ACETAMINOPHEN 5-325 MG PO TABS: 1.0000 | ORAL_TABLET | Freq: Three times a day (TID) | ORAL | Status: DC | PRN

## 2012-11-11 MED ORDER — INDOMETHACIN 50 MG PO CAPS: 50.0000 mg | ORAL_CAPSULE | Freq: Two times a day (BID) | ORAL | Status: DC

## 2012-11-11 NOTE — ED Provider Notes (Signed)
History     CSN: [id number]  Arrival date & time 11/11/12  0940   First MD Initiated Contact with Patient 11/11/12 1036      Chief Complaint  Patient presents with  . Foot Pain  . Nasal Congestion    (Consider location/radiation/quality/duration/timing/severity/associated sxs/prior treatment) Patient is a 67 y.o. male presenting with lower extremity pain. The history is provided by the patient. No language interpreter was used.  Foot Pain This is a recurrent problem. Associated symptoms include congestion, coughing, headaches and joint swelling. Pertinent negatives include no abdominal pain, arthralgias, chest pain, chills, fatigue, fever, nausea, neck pain or vomiting.  Pt is a 67yo male with hx of gout presenting today with a 3 day hx of pain in his left big toe, podagra.  Pt noticed increased redness, swelling, and pain since Friday.  Is taking uloric for maintenance of gout.  States he was here 2-3wks ago for gouty flare up in right wrist which was treated with indomethacin and percocet that knocked the attack right out.  Pt is now out of that medication and noticed this current flare up.  Painful for pt to walk.  Painful to the touch.  Denies fever, chills, n/v/d.    Pt also c/o nasal congestion x2 weeks with increased facial pressure and thickened mucous production.  Pt has tried OTC cold medications without relief.  Pt does not have hx of asthma but has noticed increased productive cough with the nasal congestion.  No known sick contacts.  Denies fever, chills, n/v/d.  No shortness of breath.    Past Medical History  Diagnosis Date  . Diabetes mellitus without complication   . Hypertension   . High cholesterol   . Gout   . Chronic kidney disease, stage III (moderate)   . CAD (coronary artery disease)     a. NSTEMI 12/13:  LHC 07/08/12: mDx 30%, inferior subbranch of the CFX 40-50%, mRCA 30%, very small-caliber subbranch off the distal PL branch 99% (small)-culprit vessel-2 small  for PCI. Medical therapy recommended  . Hx of echocardiogram     a. Echo 07/07/12:  Mild LVH, EF 60-65%, Gr 1 diast dysfn, mildly dilated Ao root (38 mm), Tr MR, mild LAE, PASP 23.    History reviewed. No pertinent past surgical history.  History reviewed. No pertinent family history.  History  Substance Use Topics  . Smoking status: Never Smoker   . Smokeless tobacco: Never Used  . Alcohol Use: Not on file      Review of Systems  Constitutional: Negative for fever, chills and fatigue.  HENT: Positive for congestion, rhinorrhea, sneezing and postnasal drip. Negative for neck pain and neck stiffness.   Respiratory: Positive for cough and wheezing. Negative for choking, chest tightness, shortness of breath and stridor.   Cardiovascular: Negative for chest pain and palpitations.  Gastrointestinal: Negative for nausea, vomiting, abdominal pain and diarrhea.  Musculoskeletal: Positive for joint swelling and gait problem. Negative for arthralgias.  Skin: Positive for color change. Negative for wound.  Neurological: Positive for headaches.    Allergies  Review of patient's allergies indicates no known allergies.  Home Medications   Current Outpatient Rx  Name  Route  Sig  Dispense  Refill  . amLODipine (NORVASC) 10 MG tablet   Oral   Take 1 tablet (10 mg total) by mouth daily.   30 tablet   11   . aspirin EC 81 MG tablet   Oral   Take 81 mg by mouth daily.         Marland Kitchen  atorvastatin (LIPITOR) 80 MG tablet   Oral   Take 1 tablet (80 mg total) by mouth daily.   30 tablet   11   . clopidogrel (PLAVIX) 75 MG tablet   Oral   Take 1 tablet (75 mg total) by mouth daily.   30 tablet   11   . febuxostat (ULORIC) 40 MG tablet   Oral   Take 40 mg by mouth daily.         . metoprolol tartrate (LOPRESSOR) 25 MG tablet   Oral   Take 1 tablet (25 mg total) by mouth 2 (two) times daily.   60 tablet   11   . multivitamin-iron-minerals-folic acid (CENTRUM) chewable tablet    Oral   Chew 1 tablet by mouth daily.         . nitroGLYCERIN (NITROSTAT) 0.4 MG SL tablet   Sublingual   Place 1 tablet (0.4 mg total) under the tongue every 5 (five) minutes as needed for chest pain.   25 tablet   3   . pregabalin (LYRICA) 100 MG capsule   Oral   Take 100 mg by mouth at bedtime as needed. For leg pain         . Tamsulosin HCl (FLOMAX) 0.4 MG CAPS   Oral   Take 0.4 mg by mouth daily as needed (for urinary flow).          Marland Kitchen amoxicillin-clavulanate (AUGMENTIN) 250-62.5 MG/5ML suspension   Oral   Take 5 mLs (250 mg total) by mouth 3 (three) times daily.   150 mL   0   . indomethacin (INDOCIN) 50 MG capsule   Oral   Take 1 capsule (50 mg total) by mouth 2 (two) times daily with a meal.   15 capsule   0   . oxyCODONE-acetaminophen (PERCOCET/ROXICET) 5-325 MG per tablet   Oral   Take 1 tablet by mouth every 8 (eight) hours as needed for pain.   6 tablet   0     BP 164/95  Pulse 118  Temp(Src) 98.5 F (36.9 C) (Oral)  Resp 20  Ht 6\' 2"  (1.88 m)  Wt 243 lb (110.224 kg)  BMI 31.19 kg/m2  SpO2 98%  Physical Exam  Nursing note and vitals reviewed. Constitutional: He appears well-developed and well-nourished. No distress.  HENT:  Head: Normocephalic and atraumatic.    Right Ear: Hearing, tympanic membrane, external ear and ear canal normal.  Left Ear: Hearing, tympanic membrane, external ear and ear canal normal.  Nose: Mucosal edema and rhinorrhea ( clear) present.  Mouth/Throat: Uvula is midline and mucous membranes are normal. Posterior oropharyngeal erythema present. No oropharyngeal exudate, posterior oropharyngeal edema or tonsillar abscesses.  TTP of frontal sinuses   Eyes: Conjunctivae are normal. No scleral icterus.  Neck: Normal range of motion. Neck supple. No JVD present. No tracheal deviation present. No thyromegaly present.  Cardiovascular: Regular rhythm and normal heart sounds.   Tachycardic   Pulmonary/Chest: Effort normal  and breath sounds normal. No stridor. No respiratory distress. He has no wheezes. He has no rales. He exhibits no tenderness.  Abdominal: Soft. Bowel sounds are normal. He exhibits no distension and no mass. There is no tenderness. There is no rebound and no guarding.  Musculoskeletal: Normal range of motion. He exhibits edema ( mild joint swelling of left distal 1st MTP joint  ) and tenderness ( TTP of left big toe).  Lymphadenopathy:    He has no cervical adenopathy.  Neurological: He is  alert.  Skin: Skin is warm and dry. No rash noted. He is not diaphoretic. There is erythema. No pallor.       ED Course  Procedures (including critical care time)  Labs Reviewed - No data to display No results found.   1. Sinusitis, acute frontal   2. Gout flare       MDM  Pt with hx of gout and DM presenting with 4 days of gout-like symptoms with swelling, pain, and increased redness of left podagra. Pt states he was seen 2-3wks ago for gouty flare up in right wrist.  States medication given, indomethacin and Vicodin helped with the pain last time.  Pt is on Uloric daily for gout maintenance. Denies fever, n/v/d.  Painful to touch and painful to walk.  Feels like normal gouty attack.   -Left podagra with erythema and TTP.  Vascular/sensory/motor in tact. No red streaking.  No wound or signs of trauma.  Not concerned for cellulitis.  Appears to be typical gouty flare of MCP joint.   Pt also c/o nasal congestion and productive cough x2wks with increased nasal pressure and thickened mucous.   TTP over frontal sinuses.    HENT: nl TMs, erythremic and edematous nasal mucosis, TTP over frontal sinuses, erythremic oropharynx without tonsillar hypertrophy or exudates.  Lungs: CTAB, nl heart exam  Dx: Sinusitis and gout  Tx: indomethacin and percocet for gout, Augmentin for sinusitis.  Will have pt f/u with PCP urgent care if symptoms do not resolve with antibiotic tx or if gouty symptoms do not  improve with pain control and indomethacin.  Return to ED if unable to swallow fluids or if develop uncontrollable fever.  Vitals: unremarkable. Discharged in stable condition.    Discussed pt with attending during ED encounter.    Junius Finner, PA-C 11/11/12 2006

## 2012-11-11 NOTE — ED Notes (Signed)
Pt c/o gout in left foot with swelling x 4 days; pt sts hx of same; pt sts nasal congestion x 2 weeks

## 2012-11-11 NOTE — ED Notes (Signed)
Left great toe swollen, red and tender. Started 3 days ago. Hx of gout taking Rx. Also c/o "sinus infection" x 2 -3 weeks. States nasal discharge is yellow.

## 2012-11-12 ENCOUNTER — Ambulatory Visit: Payer: Medicare Other | Admitting: Cardiology

## 2012-11-13 NOTE — ED Provider Notes (Signed)
Medical screening examination/treatment/procedure(s) were performed by non-physician practitioner and as supervising physician I was immediately available for consultation/collaboration.   Richardean Canal, MD 11/13/12 [phone]

## 2013-04-21 ENCOUNTER — Telehealth: Payer: Self-pay | Admitting: Physician Assistant

## 2013-04-21 NOTE — Telephone Encounter (Signed)
Pt's friend called because she was with him at church yesterday 9/21 when he started having chest pain, his head went back & he grabbed his chest. Pt thinks it's related to heartburn but friend is aware of his previous MI & is very concerned. She gave him extra strength ASA at church with no relief, then she gave him Nitroglycerin x 1 as soon as they got home that provided some relief. Please call Atha [phone] or pt back with advice.

## 2013-04-21 NOTE — Telephone Encounter (Signed)
I spoke with the patient. He states that he was at church yesterday when symptoms of chest discomfort developed. He took 1 NTG with some relief, but states he also drank a sprite and after belching his symptoms were much improved. He has a history of an MI. He states symptoms yesterday were different from when he had his MI as the pain radiated down his left arm. He states he feels much better today. I have advised the patient that his symptoms sound like they were more GI in nature, but if they reoccur and persists, he should report to the ER. He is agreeable.

## 2013-05-31 HISTORY — PX: NM MYOVIEW LTD: HXRAD82

## 2013-06-11 ENCOUNTER — Encounter: Payer: Self-pay | Admitting: *Deleted

## 2013-06-11 ENCOUNTER — Encounter: Payer: Self-pay | Admitting: Cardiology

## 2013-06-11 ENCOUNTER — Ambulatory Visit (INDEPENDENT_AMBULATORY_CARE_PROVIDER_SITE_OTHER): Payer: Medicare Other | Admitting: Cardiology

## 2013-06-11 VITALS — BP 156/100 | HR 78 | Ht 74.0 in | Wt 238.0 lb

## 2013-06-11 DIAGNOSIS — R079 Chest pain, unspecified: Secondary | ICD-10-CM

## 2013-06-11 DIAGNOSIS — I10 Essential (primary) hypertension: Secondary | ICD-10-CM

## 2013-06-11 DIAGNOSIS — I25810 Atherosclerosis of coronary artery bypass graft(s) without angina pectoris: Secondary | ICD-10-CM

## 2013-06-11 DIAGNOSIS — E7800 Pure hypercholesterolemia, unspecified: Secondary | ICD-10-CM

## 2013-06-11 DIAGNOSIS — R0602 Shortness of breath: Secondary | ICD-10-CM

## 2013-06-11 LAB — BASIC METABOLIC PANEL
BUN: 19 mg/dL (ref 6–23)
Calcium: 9.6 mg/dL (ref 8.4–10.5)
Chloride: 103 mEq/L (ref 96–112)
Creatinine, Ser: 1.7 mg/dL — ABNORMAL HIGH (ref 0.4–1.5)

## 2013-06-11 LAB — LIPID PANEL
Cholesterol: 145 mg/dL (ref 0–200)
HDL: 45.9 mg/dL (ref >39.00)
LDL Cholesterol: 77 mg/dL (ref 0–99)
Total CHOL/HDL Ratio: 3
Triglycerides: 109 mg/dL (ref 0.0–149.0)
VLDL: 21.8 mg/dL (ref 0.0–40.0)

## 2013-06-11 LAB — BRAIN NATRIURETIC PEPTIDE: Pro B Natriuretic peptide (BNP): 20 pg/mL (ref 0.0–100.0)

## 2013-06-11 NOTE — Progress Notes (Signed)
Patient ID: Jesse Petersen, male   DOB: Nov 27, 1945, 67 y.o.   MRN: [id number] PCP: Dr. Ralene Ok  67 yo with history of HTN and CAD s/p NSTEMI presents for cardiology followup.  Patient had NSTEMI in 12/13.  He had 99% stenosis in a small PLV that was too small for PCI.  He was medically managed.  EF was preserved on echo.  He did well until last month.  Last month, he developed chest tightness and fullness episodes.  No definite trigger and not exertional.  Sometimes they were relieved by antacids but not always.  He also developed exertional dyspnea.  He is now getting short of breath if he walks fast/rushes or carries a moderate load.  No orthopnea or PND.  He is concerned about these new symptoms over the last few weeks so made an appointment today.  BP is high today but on his home cuff, BP runs 120s/80s (he checks daily).    ECG: NSR, 1st degree AV block, LVH  PMH: 1. HTN 2. CAD: NSTEMI 12/13.  LHC with 99% stenosis in small PLV (culprit lesion, too small to intervene), 40-50% OM2.  Echo (12/13) with EF 60-65%, mild LVH.  3. Hyperlipidemia 4. CKD  SH: Lives in [place], prior smoker (none currently), prior cocaine.  Retired Theatre stage manager.   FH: NO premature CAD.   ROS: All systems reviewed and negative except as per HPI.   Current Outpatient Prescriptions  Medication Sig Dispense Refill  . amLODipine (NORVASC) 10 MG tablet Take 1 tablet (10 mg total) by mouth daily.  30 tablet  11  . aspirin EC 81 MG tablet Take 81 mg by mouth daily.      Marland Kitchen atorvastatin (LIPITOR) 80 MG tablet Take 1 tablet (80 mg total) by mouth daily.  30 tablet  11  . clopidogrel (PLAVIX) 75 MG tablet Take 1 tablet (75 mg total) by mouth daily.  30 tablet  11  . febuxostat (ULORIC) 40 MG tablet Take 40 mg by mouth daily.      Marland Kitchen loratadine (CLARITIN) 10 MG tablet Take 10 mg by mouth daily.      . metoprolol tartrate (LOPRESSOR) 25 MG tablet Take 1 tablet (25 mg total) by mouth 2 (two) times daily.  60 tablet  11  .  multivitamin-iron-minerals-folic acid (CENTRUM) chewable tablet Chew 1 tablet by mouth daily.      . nitroGLYCERIN (NITROSTAT) 0.4 MG SL tablet Place 1 tablet (0.4 mg total) under the tongue every 5 (five) minutes as needed for chest pain.  25 tablet  3  . oxyCODONE-acetaminophen (PERCOCET/ROXICET) 5-325 MG per tablet Take 1 tablet by mouth every 8 (eight) hours as needed for pain.  6 tablet  0  . pregabalin (LYRICA) 100 MG capsule Take 100 mg by mouth at bedtime as needed. For leg pain      . Tamsulosin HCl (FLOMAX) 0.4 MG CAPS Take 0.4 mg by mouth daily as needed (for urinary flow).        No current facility-administered medications for this visit.    BP 156/100  Pulse 78  Ht 6\' 2"  (1.88 m)  Wt 238 lb (107.956 kg)  BMI 30.54 kg/m2 General: NAD Neck: No JVD, no thyromegaly or thyroid nodule.  Lungs: Clear to auscultation bilaterally with normal respiratory effort. CV: Nondisplaced PMI.  Heart regular S1/S2, +S4, 1/6 SEM.  No peripheral edema.  No carotid bruit.  Normal pedal pulses.  Abdomen: Soft, nontender, no hepatosplenomegaly, no distention.  Skin: Intact without lesions  or rashes.  Neurologic: Alert and oriented x 3.  Psych: Normal affect. Extremities: No clubbing or cyanosis.   Assessment/Plan: 1. CAD: NSTEMI in 12/13 with culprit lesion in a small PLV (too small for intervention).  Patient has developed atypical chest pain and new exertional dyspnea.  I am concerned that this could represent angina.   - I will arrange for ETT-Cardiolite.   - Continue ASA 81, statin, Plavix, metoprolol.  Plan had been to continue Plavix x 1 year after NSTEMI.  Will continue it for now until I can tell whether or not current symptoms are cardiac related.  2. Dyspnea: New exertional dyspnea.  Patient is not volume overloaded on exam.  I will get a Cardiolite as above.  I will also arrange an echo and check BNP.  3. Hyperlipidemia: Check lipids, goal LDL < 70.  4. CKD: BMET today.  CKD is likely  due to HTN.  5. HTN: BP high in the office today but readings at home are normal.  He will bring BP cuff in for calibration at next appointment.   Marca Ancona 06/11/2013

## 2013-06-11 NOTE — Patient Instructions (Signed)
Your physician recommends that you have  lab work today--Lipid profile/BMET/BNP.  Your physician has requested that you have en exercise stress myoview. For further information please visit [email or web address] Please follow instruction sheet, as given.  Your physician has requested that you regularly monitor and record your blood pressure readings at home. Please use the same machine at the same time of day to check your readings and record them to bring to your follow-up visit. Bring your blood cuff with you.    Your physician recommends that you schedule a follow-up appointment in: 2 weeks with Dr Shirlee Latch.

## 2013-06-12 NOTE — Addendum Note (Signed)
Addended by: Jacqlyn Krauss on: 06/12/2013 08:41 AM   Modules accepted: Orders

## 2013-06-17 ENCOUNTER — Telehealth: Payer: Self-pay | Admitting: Cardiology

## 2013-06-17 NOTE — Telephone Encounter (Signed)
Spoke with patient about recent lab results 

## 2013-06-17 NOTE — Telephone Encounter (Signed)
New message ° ° ° ° °Returned a nurses call °

## 2013-06-23 ENCOUNTER — Ambulatory Visit (HOSPITAL_COMMUNITY): Payer: Medicare Other | Attending: Cardiology | Admitting: Radiology

## 2013-06-23 ENCOUNTER — Encounter: Payer: Self-pay | Admitting: Internal Medicine

## 2013-06-23 ENCOUNTER — Encounter: Payer: Self-pay | Admitting: *Deleted

## 2013-06-23 VITALS — BP 141/104 | Ht 74.0 in | Wt 236.0 lb

## 2013-06-23 DIAGNOSIS — R079 Chest pain, unspecified: Secondary | ICD-10-CM

## 2013-06-23 DIAGNOSIS — I491 Atrial premature depolarization: Secondary | ICD-10-CM

## 2013-06-23 DIAGNOSIS — R0602 Shortness of breath: Secondary | ICD-10-CM

## 2013-06-23 DIAGNOSIS — E119 Type 2 diabetes mellitus without complications: Secondary | ICD-10-CM | POA: Insufficient documentation

## 2013-06-23 DIAGNOSIS — Z87891 Personal history of nicotine dependence: Secondary | ICD-10-CM | POA: Insufficient documentation

## 2013-06-23 DIAGNOSIS — R0989 Other specified symptoms and signs involving the circulatory and respiratory systems: Secondary | ICD-10-CM | POA: Insufficient documentation

## 2013-06-23 DIAGNOSIS — R0609 Other forms of dyspnea: Secondary | ICD-10-CM | POA: Insufficient documentation

## 2013-06-23 DIAGNOSIS — I252 Old myocardial infarction: Secondary | ICD-10-CM | POA: Insufficient documentation

## 2013-06-23 DIAGNOSIS — I10 Essential (primary) hypertension: Secondary | ICD-10-CM | POA: Insufficient documentation

## 2013-06-23 DIAGNOSIS — E785 Hyperlipidemia, unspecified: Secondary | ICD-10-CM | POA: Insufficient documentation

## 2013-06-23 MED ORDER — REGADENOSON 0.4 MG/5ML IV SOLN
0.4000 mg | Freq: Once | INTRAVENOUS | Status: AC
  Administered 2013-06-23: 0.4 mg via INTRAVENOUS

## 2013-06-23 MED ORDER — TECHNETIUM TC 99M SESTAMIBI GENERIC - CARDIOLITE
33.0000 | Freq: Once | INTRAVENOUS | Status: AC | PRN
  Administered 2013-06-23: 33 via INTRAVENOUS

## 2013-06-23 MED ORDER — TECHNETIUM TC 99M SESTAMIBI GENERIC - CARDIOLITE
11.0000 | Freq: Once | INTRAVENOUS | Status: AC | PRN
  Administered 2013-06-23: 11 via INTRAVENOUS

## 2013-06-23 NOTE — Progress Notes (Signed)
[facility] SITE 3 NUCLEAR MED [address] [place], [place] [address] [phone]    Cardiology Nuclear Med Study  Jesse Petersen is a 67 y.o. male     MRN : [id number]     DOB: [birth year]  Procedure Date: 06/23/2013  Nuclear Med Background Indication for Stress Test:  Evaluation for Ischemia History:  &#39;13 MI-CATH-PLB off RCA Tx Rx  '13 ECHO: EF: 60-65%, H/O Cocaine abuse Cardiac Risk Factors: History of Smoking, Hypertension, Lipids and NIDDM  Symptoms:  Chest Pain and DOE   Nuclear Pre-Procedure Caffeine/Decaff Intake:  None  12 hrs NPO After: 7:30pm   Lungs:  clear O2 Sat: 98% on room air. IV 0.9% NS with Angio Cath:  20g  IV Site: R Antecubital x 1, tolerated well IV Started by:  Irean Hong, RN  Chest Size (in):  48 Cup Size: n/a  Height: 6\' 2"  (1.88 m)  Weight:  236 lb (107.049 kg)  BMI:  Body mass index is 30.29 kg/(m^2). Tech Comments:  Held Lopressor x 24 hrs    Nuclear Med Study 1 or 2 day study: 1 day  Stress Test Type:  Lexiscan  Reading MD: Dietrich Pates, MD  Order Authorizing Provider:  Marca Ancona,  MD  Resting Radionuclide: Technetium 40m Sestamibi  Resting Radionuclide Dose: 11.0 mCi   Stress Radionuclide:  Technetium 6m Sestamibi  Stress Radionuclide Dose: 33.0 mCi           Stress Protocol Rest HR: 75 Stress HR: 108  Rest BP: 141/104 Stress BP: 152/99  Exercise Time (min): n/a METS: n/a   Predicted Max HR: 153 bpm % Max HR: 70.59 bpm Rate Pressure Product: 46962   Dose of Adenosine (mg):  n/a Dose of Lexiscan: 0.4 mg  Dose of Atropine (mg): n/a Dose of Dobutamine: n/a mcg/kg/min (at max HR)  Stress Test Technologist: Milana Na, EMT-P  Nuclear Technologist:  Dario Guardian, CNMT     Rest Procedure:  Myocardial perfusion imaging was performed at rest 45 minutes following the intravenous administration of Technetium 81m Sestamibi. Rest ECG: NSR - Normal EKG  Stress Procedure:  The patient received IV Lexiscan 0.4 mg over  15-seconds.  Technetium 62m Sestamibi injected at 30-seconds. This patient had sob and abdominal pain with the Lexiscan injection. Quantitative spect images were obtained after a 45 minute delay. Stress ECG: No significant change from baseline ECG  QPS Raw Data Images: Soft tissue (diaphragm, bowel activity) underlies heart.   Stress Images:  Small mild defect in the distal inferior and apical walls  Otherise normal perfusion.   Rest Images:  Comparison with the stress images reveals no significant change. Subtraction (SDS):  No evidence of ischemia. Transient Ischemic Dilatation (Normal <1.22):  1.07 Lung/Heart Ratio (Normal <0.45):  0.31  Quantitative Gated Spect Images QGS EDV:  100 ml QGS ESV:  39 ml  Impression Exercise Capacity:  Lexiscan with no exercise. BP Response:  Normal blood pressure response. Clinical Symptoms:  No chest pain. ECG Impression:  No significant ST segment change suggestive of ischemia. Comparison with Prior Nuclear Study: No prior study.  Overall Impression:  Probable normal perfusion and minimal soft tissue attenuation (diaphragm)  Low risk scan.    LV Ejection Fraction: 61%.  LV Wall Motion:  NL LV Function; NL Wall Motion  Dietrich Pates

## 2013-06-24 ENCOUNTER — Ambulatory Visit (INDEPENDENT_AMBULATORY_CARE_PROVIDER_SITE_OTHER): Payer: Medicare Other | Admitting: Cardiology

## 2013-06-24 ENCOUNTER — Encounter: Payer: Self-pay | Admitting: Cardiology

## 2013-06-24 VITALS — BP 142/90 | HR 67 | Ht 74.0 in | Wt 240.0 lb

## 2013-06-24 DIAGNOSIS — I10 Essential (primary) hypertension: Secondary | ICD-10-CM

## 2013-06-24 DIAGNOSIS — E7800 Pure hypercholesterolemia, unspecified: Secondary | ICD-10-CM

## 2013-06-24 DIAGNOSIS — I25810 Atherosclerosis of coronary artery bypass graft(s) without angina pectoris: Secondary | ICD-10-CM

## 2013-06-24 NOTE — Patient Instructions (Signed)
Stop Plavix in January 2015.  Your physician wants you to follow-up in: 6 months with Dr Shirlee Latch. (May 2015).You will receive a reminder letter in the mail two months in advance. If you don't receive a letter, please call our office to schedule the follow-up appointment.

## 2013-06-24 NOTE — Progress Notes (Signed)
Patient ID: Jesse Petersen, male   DOB: Oct 07, 1945, 67 y.o.   MRN: [id number] PCP: Dr. Ralene Ok  67 yo with history of HTN and CAD s/p NSTEMI presents for cardiology followup.  Patient had NSTEMI in 12/13.  He had 99% stenosis in a small PLV that was too small for PCI.  He was medically managed.  EF was preserved on echo.  He did well until last month.  Last month, he developed chest tightness and fullness episodes.  No definite trigger and not exertional.  Sometimes they were relieved by antacids but not always.  He also developed exertional dyspnea.  He is now getting short of breath if he walks fast/rushes or carries a moderate load.  No orthopnea or PND.   I had him do a Best boy.  This showed no ischemia, EF 61%.  Since that time, his chest pain seems to have resolved.  He still gets winded easily.   Labs (11/14): LDL 77, HDL 46, K 4.1, creatinine 1.7, BNP 20  PMH: 1. HTN 2. CAD: NSTEMI 12/13.  LHC with 99% stenosis in small PLV (culprit lesion, too small to intervene), 40-50% OM2.  Echo (12/13) with EF 60-65%, mild LVH.  Lexiscan Cardiolite (11/14): EF 61%, no ischemia.  3. Hyperlipidemia 4. CKD  SH: Lives in [place], prior smoker (none currently), prior cocaine.  Retired Theatre stage manager.   FH: No premature CAD.    Current Outpatient Prescriptions  Medication Sig Dispense Refill  . amLODipine (NORVASC) 10 MG tablet Take 1 tablet (10 mg total) by mouth daily.  30 tablet  11  . aspirin EC 81 MG tablet Take 81 mg by mouth daily.      Marland Kitchen atorvastatin (LIPITOR) 80 MG tablet Take 1 tablet (80 mg total) by mouth daily.  30 tablet  11  . clopidogrel (PLAVIX) 75 MG tablet 1 daily--Stop Plavix in January 2015      . febuxostat (ULORIC) 40 MG tablet Take 40 mg by mouth daily.      Marland Kitchen loratadine (CLARITIN) 10 MG tablet Take 10 mg by mouth daily.      . metoprolol tartrate (LOPRESSOR) 25 MG tablet Take 1 tablet (25 mg total) by mouth 2 (two) times daily.  60 tablet  11  .  multivitamin-iron-minerals-folic acid (CENTRUM) chewable tablet Chew 1 tablet by mouth daily.      . nitroGLYCERIN (NITROSTAT) 0.4 MG SL tablet Place 1 tablet (0.4 mg total) under the tongue every 5 (five) minutes as needed for chest pain.  25 tablet  3  . oxyCODONE-acetaminophen (PERCOCET/ROXICET) 5-325 MG per tablet Take 1 tablet by mouth every 8 (eight) hours as needed for pain.  6 tablet  0  . pregabalin (LYRICA) 100 MG capsule Take 100 mg by mouth at bedtime as needed. For leg pain      . Tamsulosin HCl (FLOMAX) 0.4 MG CAPS Take 0.4 mg by mouth daily as needed (for urinary flow).        No current facility-administered medications for this visit.    BP 142/90  Pulse 67  Ht 6\' 2"  (1.88 m)  Wt 108.863 kg (240 lb)  BMI 30.80 kg/m2  SpO2 99% General: NAD Neck: No JVD, no thyromegaly or thyroid nodule.  Lungs: Clear to auscultation bilaterally with normal respiratory effort. CV: Nondisplaced PMI.  Heart regular S1/S2, +S4, 1/6 SEM.  No peripheral edema.  No carotid bruit.  Normal pedal pulses.  Abdomen: Soft, nontender, no hepatosplenomegaly, no distention.  Skin: Intact without lesions or  rashes.  Neurologic: Alert and oriented x 3.  Psych: Normal affect. Extremities: No clubbing or cyanosis.   Assessment/Plan: 1. CAD: NSTEMI in 12/13 with culprit lesion in a small PLV (too small for intervention).  He had atypical chest pain recently.  Lexsican Cardiolite in 11/14 showed no ischemia.  - Continue ASA 81, statin, Plavix, metoprolol.  Plan had been to continue Plavix x 1 year after NSTEMI => stop in 1/15.   2. Dyspnea: Patient has dyspnea with moderate to heavy exertion.  Patient is not volume overloaded on exam and BNP is normal.  EF normal on Cardiolite. I would like him to increase his exercise to try to build up his stamina.  3. Hyperlipidemia: Good lipids 11/14.  4. CKD: Stable.  CKD is likely due to HTN.  5. HTN: He calibrated his BP cuff with our cuff in the office today (very  close).  SBP at home runs in 120s.   Followup in 6 months.  Marca Ancona 06/24/2013

## 2013-06-28 ENCOUNTER — Other Ambulatory Visit (HOSPITAL_COMMUNITY): Payer: Self-pay | Admitting: Physician Assistant

## 2013-07-10 ENCOUNTER — Other Ambulatory Visit (HOSPITAL_COMMUNITY): Payer: Self-pay | Admitting: Physician Assistant

## 2013-07-22 ENCOUNTER — Other Ambulatory Visit (HOSPITAL_COMMUNITY): Payer: Self-pay | Admitting: Physician Assistant

## 2013-07-23 IMAGING — US US ABDOMEN COMPLETE
1 series · 13 of 25 positions shown · non-contrast
Comparison: None.

CLINICAL DATA: Abnormal LFTs.   Hypertension

COMPLETE ABDOMINAL ULTRASOUND

[Series 1: us abdomen complete · 0.31mm/px · 13 of 83 slices shown]
[im 1/83]
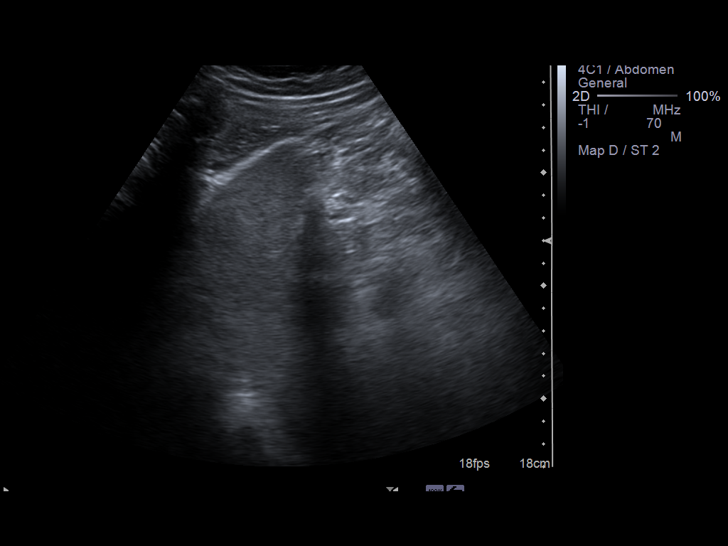
[im 7/83]
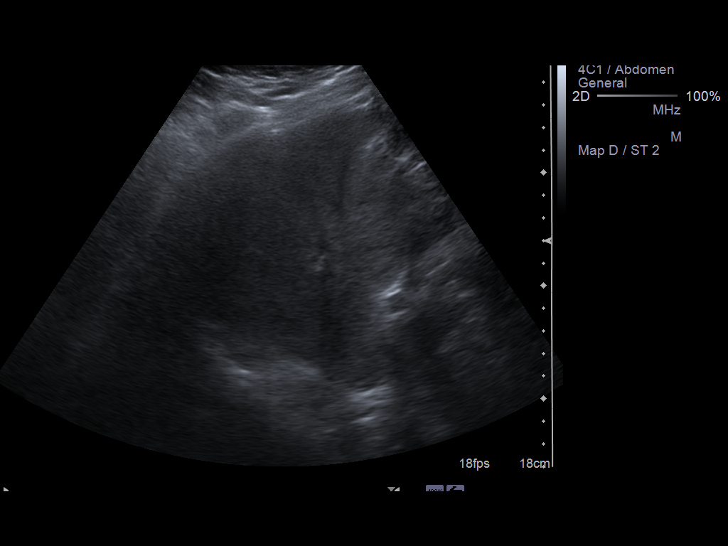
[im 14/83]
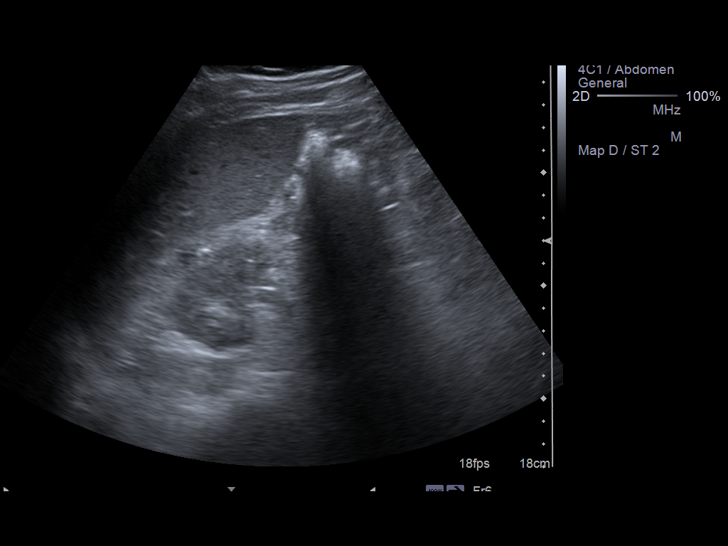
[im 21/83]
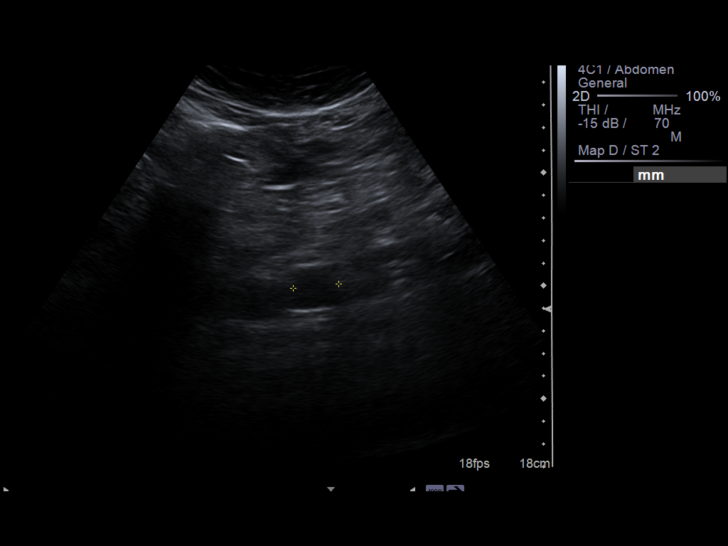
[im 28/83]
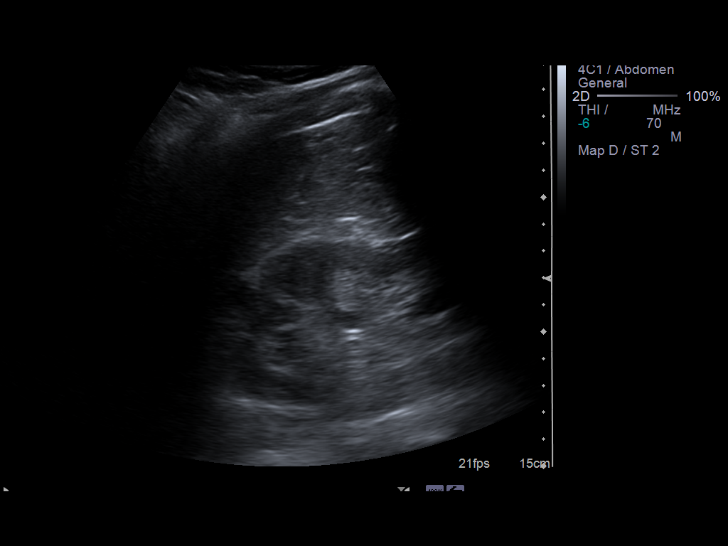
[im 35/83]
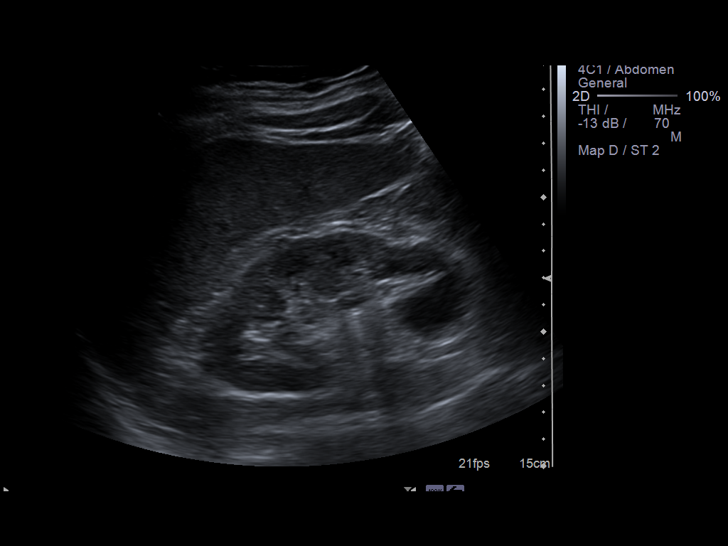
[im 42/83]
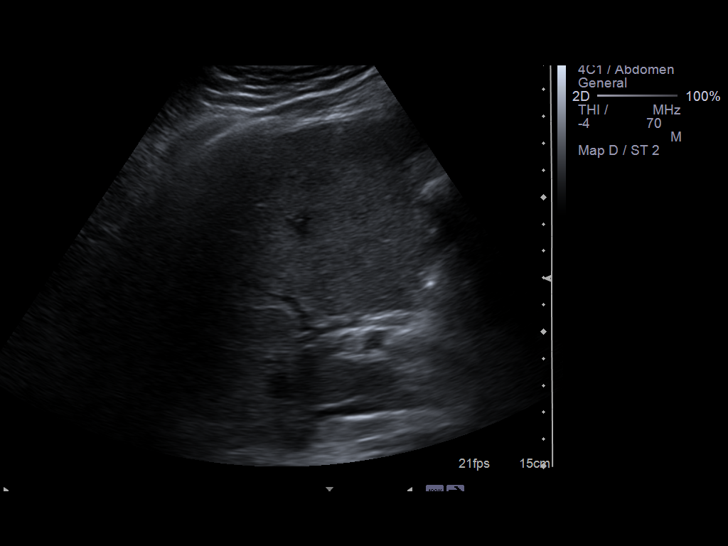
[im 48/83]
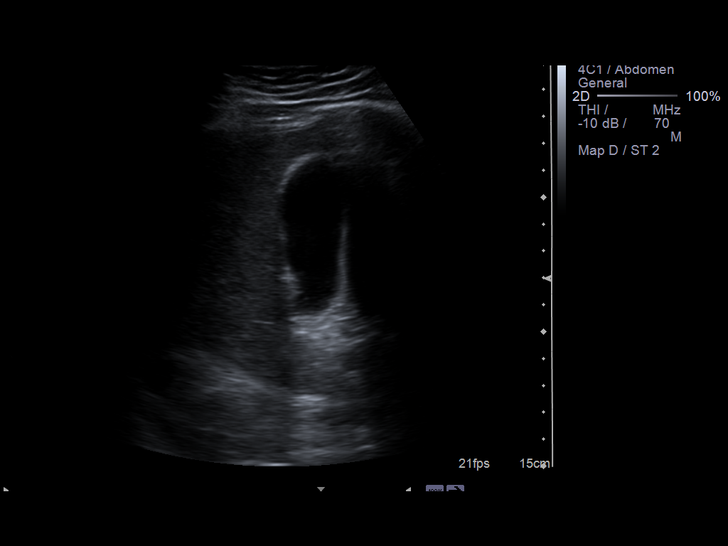
[im 55/83]
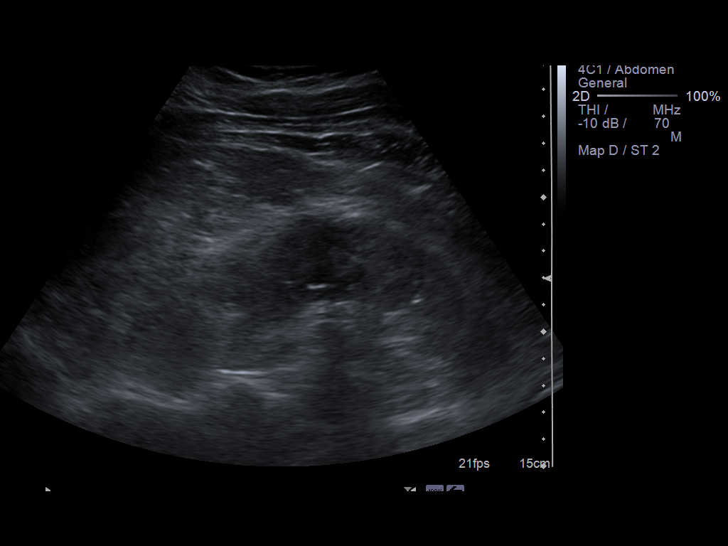
[im 62/83]
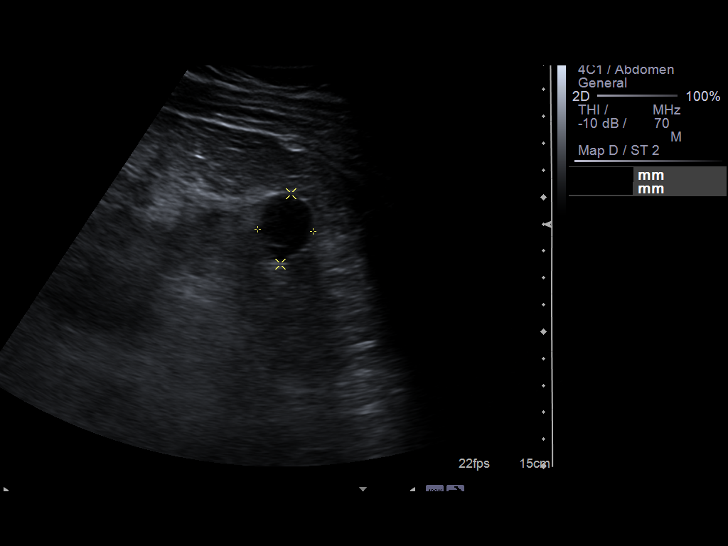
[im 69/83]
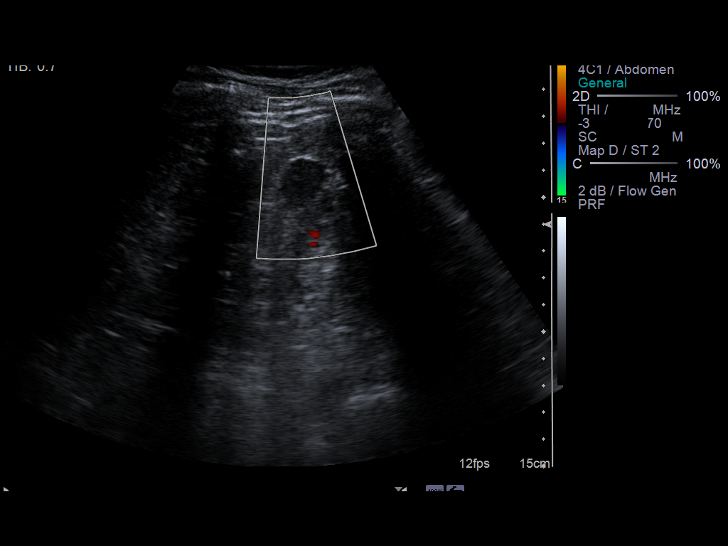
[im 76/83]
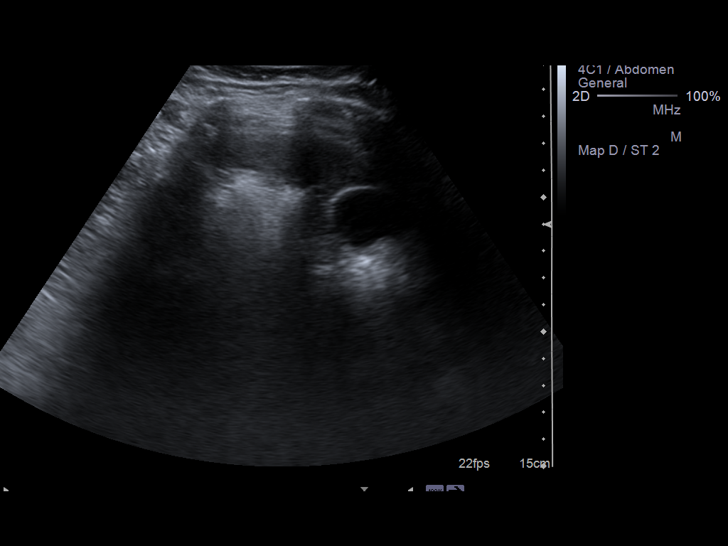
[im 83/83]
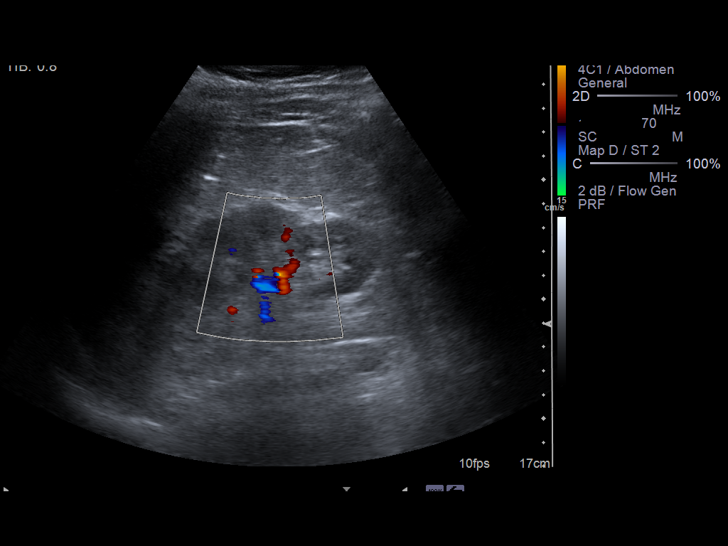

[13 of 25 positions shown; findings below may reference images not displayed]

FINDINGS: Gallbladder:  No gallstones, gallbladder wall thickening, or
pericholecystic fluid. Evaluation for sonographic Murphy's sign is
negative.

Common bile duct:  Measures 4.0 mm in diameter

Liver:  Demonstrates some mild increase in echotexture diffusely
suggesting mild underlying hepatic steatosis.  No focal parenchymal
abnormality or signs of intrahepatic ductal dilatation is evident

IVC:  The proximal portion appears normal

Pancreas:  Is poorly visualized due to overlying gas

Spleen:  Has a sagittal 5.6 cmand a homogeneous parenchymal pattern

Right Kidney:  Measures 10.5 cm in length demonstrates several
unilocular thin-walled simple cyst with the largest located in the
lower pole measuring 3.1 x 2.7 x 1.9 cm.  No worrisome focal
parenchymal abnormalities or signs of hydronephrosis is evident

Left Kidney:  Has a sagittal length of 11.4 cm and demonstrates
multiple unilocular thin-walled simple appearing cysts with the
largest located in the upper pole measuring 3.3 x 2.6 x 3.7 cm and
in the lower pole measuring 2.1 x 2.7 x 2.0 cm.

Abdominal aorta:  Demonstrates a maximal caliber of 2.8 cm with no
focal aneurysmal dilatation evident.
IMPRESSION: Mild diffuse hepatic steatosis.

Bilateral renal cysts with no worrisome sonographic features
identified.

Poorly assessed pancreas.

## 2014-07-09 ENCOUNTER — Encounter (HOSPITAL_COMMUNITY): Payer: Self-pay | Admitting: Cardiovascular Disease

## 2014-10-18 IMAGING — CR DG CHEST 1V PORT
1 series · 1 of 1 positions shown · non-contrast
Comparison: 07/07/2012

CLINICAL DATA: Fever

PORTABLE CHEST - 1 VIEW

[AP]
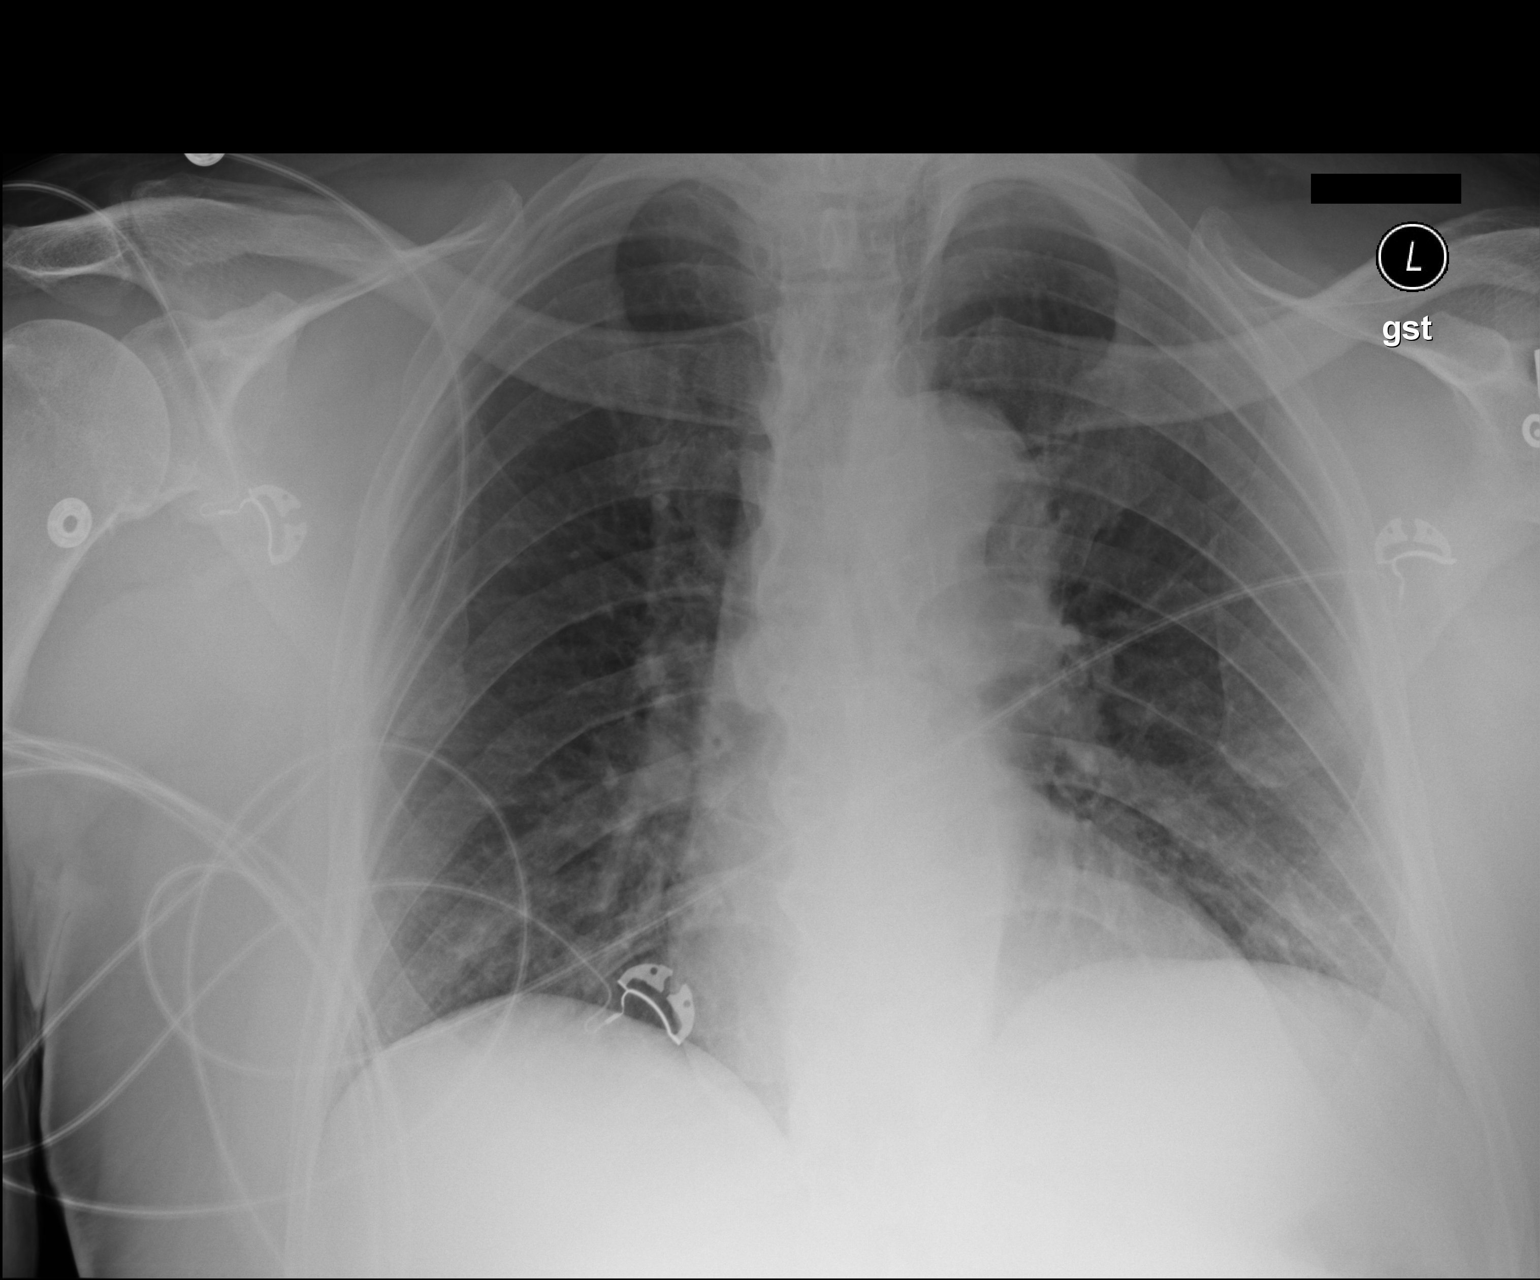

[1 of 1 positions shown; findings below may reference images not displayed]

FINDINGS: Shallow inspiration.  Mild atelectasis in the left lung
base. The heart size and pulmonary vascularity are normal. The
lungs appear clear and expanded without focal air space disease or
consolidation. No blunting of the costophrenic angles.  No
pneumothorax.  No significant change since previous study.
IMPRESSION: No evidence of active pulmonary disease.

## 2016-01-06 ENCOUNTER — Other Ambulatory Visit: Payer: Self-pay | Admitting: Internal Medicine

## 2016-01-06 DIAGNOSIS — E1169 Type 2 diabetes mellitus with other specified complication: Secondary | ICD-10-CM

## 2016-01-13 ENCOUNTER — Inpatient Hospital Stay: Admission: RE | Admit: 2016-01-13 | Payer: Medicare Other | Source: Ambulatory Visit

## 2017-08-17 ENCOUNTER — Other Ambulatory Visit: Payer: Self-pay | Admitting: Internal Medicine

## 2017-08-17 DIAGNOSIS — I25119 Atherosclerotic heart disease of native coronary artery with unspecified angina pectoris: Secondary | ICD-10-CM

## 2017-08-24 ENCOUNTER — Ambulatory Visit
Admission: RE | Admit: 2017-08-24 | Discharge: 2017-08-24 | Disposition: A | Discharge status: Home / Self Care | Payer: Medicare Other | Source: Ambulatory Visit | Attending: Internal Medicine | Admitting: Internal Medicine

## 2017-08-24 DIAGNOSIS — I25119 Atherosclerotic heart disease of native coronary artery with unspecified angina pectoris: Secondary | ICD-10-CM

## 2017-10-01 ENCOUNTER — Other Ambulatory Visit: Payer: Self-pay | Admitting: Urology

## 2017-10-02 NOTE — Patient Instructions (Signed)
Jesse Petersen  10/02/2017   Your procedure is scheduled on:10-08-17    [facility]              Report to admitting at         1000 AM   Call this number if you have problems the morning of surgery [phone]    Remember: Do not eat food or drink liquids :After Midnight.     Take these medicines the morning of surgery with A SIP OF WATER: Rosuvastatin, metoprolol, gabapentin, finasteride, colchicine, allopurinol, chlorpheniramine                                 You may not have any metal on your body including hair pins and              piercings  Do not wear jewelry, , lotions, powders or perfumes, deodorant                     Men may shave face and neck.   Do not bring valuables to the hospital. [place] IS NOT             RESPONSIBLE   FOR VALUABLES.  Contacts, dentures or bridgework may not be worn into surgery.  Leave suitcase in the car. After surgery it may be brought to your room.                Please read over the following fact sheets you were given: _____________________________________________________________________          [facility] - Pre[facility] Before surgery, you can play an important role.  Because skin is not sterile, your skin needs to be as free of germs as possible.  You can reduce the number of germs on your skin by washing with CHG (chlorahexidine gluconate) soap before surgery.  CHG is an antiseptic cleaner which kills germs and bonds with the skin to continue killing germs even after washing. Please DO NOT use if you have an allergy to CHG or antibacterial soaps.  If your skin becomes reddened/irritated stop using the CHG and inform your nurse when you arrive at Short Stay. Do not shave (including legs and underarms) for at least 48 hours prior to the first CHG shower.  You may shave your face/neck. Please follow these instructions carefully:  1.  Shower with CHG Soap the night before  surgery and the  morning of Surgery.  2.  If you choose to wash your hair, wash your hair first as usual with your  normal  shampoo.  3.  After you shampoo, rinse your hair and body thoroughly to remove the  shampoo.                           4.  Use CHG as you would any other liquid soap.  You can apply chg directly  to the skin and wash                       Gently with a scrungie or clean washcloth.  5.  Apply the CHG Soap to your body ONLY FROM THE NECK DOWN.   Do not use on face/ open  Wound or open sores. Avoid contact with eyes, ears mouth and genitals (private parts).                       Wash face,  Genitals (private parts) with your normal soap.             6.  Wash thoroughly, paying special attention to the area where your surgery  will be performed.  7.  Thoroughly rinse your body with warm water from the neck down.  8.  DO NOT shower/wash with your normal soap after using and rinsing off  the CHG Soap.                9.  Pat yourself dry with a clean towel.            10.  Wear clean pajamas.            11.  Place clean sheets on your bed the night of your first shower and do not  sleep with pets. Day of Surgery : Do not apply any lotions/deodorants the morning of surgery.  Please wear clean clothes to the hospital/surgery center.  FAILURE TO FOLLOW THESE INSTRUCTIONS MAY RESULT IN THE CANCELLATION OF YOUR SURGERY PATIENT SIGNATURE_________________________________  NURSE SIGNATURE__________________________________  ________________________________________________________________________

## 2017-10-04 ENCOUNTER — Encounter (INDEPENDENT_AMBULATORY_CARE_PROVIDER_SITE_OTHER): Payer: Self-pay

## 2017-10-04 ENCOUNTER — Encounter (HOSPITAL_COMMUNITY): Payer: Self-pay

## 2017-10-04 ENCOUNTER — Other Ambulatory Visit: Payer: Self-pay

## 2017-10-04 ENCOUNTER — Encounter (HOSPITAL_COMMUNITY)
Admission: RE | Admit: 2017-10-04 | Discharge: 2017-10-04 | Disposition: A | Discharge status: Home / Self Care | Payer: Medicare HMO | Source: Ambulatory Visit | Attending: Urology | Admitting: Urology

## 2017-10-04 DIAGNOSIS — Z01812 Encounter for preprocedural laboratory examination: Secondary | ICD-10-CM | POA: Diagnosis not present

## 2017-10-04 DIAGNOSIS — Z01810 Encounter for preprocedural cardiovascular examination: Secondary | ICD-10-CM | POA: Diagnosis not present

## 2017-10-04 HISTORY — DX: Inflammatory liver disease, unspecified: K75.9

## 2017-10-04 HISTORY — DX: Unspecified osteoarthritis, unspecified site: M19.90

## 2017-10-04 LAB — BASIC METABOLIC PANEL
Anion gap: 7 (ref 5–15)
BUN: 20 mg/dL (ref 6–20)
CO2: 27 mmol/L (ref 22–32)
Calcium: 9.3 mg/dL (ref 8.9–10.3)
Chloride: 108 mmol/L (ref 101–111)
Creatinine, Ser: 1.56 mg/dL — ABNORMAL HIGH (ref 0.61–1.24)
GFR calc non Af Amer: 43 mL/min — ABNORMAL LOW (ref >60)
GFR, EST AFRICAN AMERICAN: 49 mL/min — AB (ref >60)
Glucose, Bld: 106 mg/dL — ABNORMAL HIGH (ref 65–99)
POTASSIUM: 3.9 mmol/L (ref 3.5–5.1)
SODIUM: 142 mmol/L (ref 135–145)

## 2017-10-04 LAB — GLUCOSE, CAPILLARY: Glucose-Capillary: 106 mg/dL — ABNORMAL HIGH (ref 65–99)

## 2017-10-04 LAB — CBC
HEMATOCRIT: 41.5 % (ref 39.0–52.0)
HEMOGLOBIN: 13.9 g/dL (ref 13.0–17.0)
MCH: 32.1 pg (ref 26.0–34.0)
MCHC: 33.5 g/dL (ref 30.0–36.0)
MCV: 95.8 fL (ref 78.0–100.0)
PLATELETS: 128 10*3/uL — AB (ref 150–400)
RBC: 4.33 MIL/uL (ref 4.22–5.81)
RDW: 13.5 % (ref 11.5–15.5)
WBC: 4.4 10*3/uL (ref 4.0–10.5)

## 2017-10-04 LAB — HEMOGLOBIN A1C
Hgb A1c MFr Bld: 5.7 % — ABNORMAL HIGH (ref 4.8–5.6)
Mean Plasma Glucose: 116.89 mg/dL

## 2017-10-04 NOTE — Progress Notes (Signed)
Clearance Dr. Ralene Okoy Moreira Chart

## 2017-10-04 NOTE — Progress Notes (Signed)
FYI pt. BP was elevated at preop appt. 190/102 and 187/110 he stated he had taken his meds.

## 2017-10-08 ENCOUNTER — Other Ambulatory Visit: Payer: Self-pay

## 2017-10-08 ENCOUNTER — Ambulatory Visit (HOSPITAL_COMMUNITY): Payer: Medicare HMO | Admitting: Anesthesiology

## 2017-10-08 ENCOUNTER — Encounter (HOSPITAL_COMMUNITY)
Admission: RE | Disposition: A | Discharge status: Home / Self Care | Payer: Self-pay | Source: Ambulatory Visit | Attending: Urology

## 2017-10-08 ENCOUNTER — Encounter (HOSPITAL_COMMUNITY): Payer: Self-pay | Admitting: Emergency Medicine

## 2017-10-08 ENCOUNTER — Observation Stay (HOSPITAL_COMMUNITY)
Admission: RE | Admit: 2017-10-08 | Discharge: 2017-10-09 | Disposition: A | Discharge status: Home / Self Care | Payer: Medicare HMO | Source: Ambulatory Visit | Attending: Urology | Admitting: Urology

## 2017-10-08 DIAGNOSIS — E119 Type 2 diabetes mellitus without complications: Secondary | ICD-10-CM | POA: Diagnosis not present

## 2017-10-08 DIAGNOSIS — Z79899 Other long term (current) drug therapy: Secondary | ICD-10-CM | POA: Insufficient documentation

## 2017-10-08 DIAGNOSIS — Z7982 Long term (current) use of aspirin: Secondary | ICD-10-CM | POA: Diagnosis not present

## 2017-10-08 DIAGNOSIS — N401 Enlarged prostate with lower urinary tract symptoms: Secondary | ICD-10-CM | POA: Diagnosis not present

## 2017-10-08 DIAGNOSIS — I252 Old myocardial infarction: Secondary | ICD-10-CM | POA: Insufficient documentation

## 2017-10-08 DIAGNOSIS — N320 Bladder-neck obstruction: Secondary | ICD-10-CM | POA: Diagnosis not present

## 2017-10-08 DIAGNOSIS — I10 Essential (primary) hypertension: Secondary | ICD-10-CM | POA: Insufficient documentation

## 2017-10-08 DIAGNOSIS — N400 Enlarged prostate without lower urinary tract symptoms: Secondary | ICD-10-CM | POA: Diagnosis present

## 2017-10-08 DIAGNOSIS — I2510 Atherosclerotic heart disease of native coronary artery without angina pectoris: Secondary | ICD-10-CM | POA: Diagnosis not present

## 2017-10-08 DIAGNOSIS — B1920 Unspecified viral hepatitis C without hepatic coma: Secondary | ICD-10-CM | POA: Diagnosis not present

## 2017-10-08 DIAGNOSIS — N138 Other obstructive and reflux uropathy: Secondary | ICD-10-CM | POA: Insufficient documentation

## 2017-10-08 HISTORY — PX: TRANSURETHRAL RESECTION OF PROSTATE: SHX73

## 2017-10-08 LAB — GLUCOSE, CAPILLARY: Glucose-Capillary: 105 mg/dL — ABNORMAL HIGH (ref 65–99)

## 2017-10-08 LAB — HEMOGLOBIN AND HEMATOCRIT, BLOOD
HCT: 40.8 % (ref 39.0–52.0)
Hemoglobin: 13.6 g/dL (ref 13.0–17.0)

## 2017-10-08 SURGERY — TURP (TRANSURETHRAL RESECTION OF PROSTATE)
Anesthesia: General

## 2017-10-08 MED ORDER — ONDANSETRON HCL 4 MG/2ML IJ SOLN: 4.0000 mg | INTRAMUSCULAR | Status: DC | PRN

## 2017-10-08 MED ORDER — AMLODIPINE BESYLATE 10 MG PO TABS: 10.0000 mg | ORAL_TABLET | Freq: Every day | ORAL | Status: DC

## 2017-10-08 MED ORDER — PROPOFOL 10 MG/ML IV BOLUS
INTRAVENOUS | Status: DC | PRN
  Administered 2017-10-08: 180 mg via INTRAVENOUS

## 2017-10-08 MED ORDER — HYDRALAZINE HCL 20 MG/ML IJ SOLN: 10.0000 mg | Freq: Three times a day (TID) | INTRAMUSCULAR | Status: DC | PRN

## 2017-10-08 MED ORDER — MORPHINE SULFATE (PF) 4 MG/ML IV SOLN
2.0000 mg | INTRAVENOUS | Status: DC | PRN
  Administered 2017-10-08: 4 mg via INTRAVENOUS
  Administered 2017-10-09: 2 mg via INTRAVENOUS
  Filled 2017-10-08 (×2): qty 1

## 2017-10-08 MED ORDER — OXYCODONE HCL 5 MG PO TABS: 5.0000 mg | ORAL_TABLET | Freq: Once | ORAL | Status: DC | PRN

## 2017-10-08 MED ORDER — OXYCODONE HCL 5 MG/5ML PO SOLN
5.0000 mg | Freq: Once | ORAL | Status: DC | PRN
  Filled 2017-10-08: qty 5

## 2017-10-08 MED ORDER — SENNOSIDES-DOCUSATE SODIUM 8.6-50 MG PO TABS
2.0000 | ORAL_TABLET | Freq: Every day | ORAL | Status: DC
  Administered 2017-10-08: 2 via ORAL
  Filled 2017-10-08: qty 2

## 2017-10-08 MED ORDER — CEFAZOLIN SODIUM-DEXTROSE 2-4 GM/100ML-% IV SOLN
2.0000 g | INTRAVENOUS | Status: AC
  Administered 2017-10-08: 2 g via INTRAVENOUS
  Filled 2017-10-08: qty 100

## 2017-10-08 MED ORDER — ALLOPURINOL 100 MG PO TABS
100.0000 mg | ORAL_TABLET | Freq: Every day | ORAL | Status: DC
  Administered 2017-10-08 – 2017-10-09 (×2): 100 mg via ORAL
  Filled 2017-10-08 (×2): qty 1

## 2017-10-08 MED ORDER — FENTANYL CITRATE (PF) 250 MCG/5ML IJ SOLN
INTRAMUSCULAR | Status: DC | PRN
  Administered 2017-10-08: 100 ug via INTRAVENOUS
  Administered 2017-10-08: 50 ug via INTRAVENOUS

## 2017-10-08 MED ORDER — ONDANSETRON HCL 4 MG/2ML IJ SOLN
INTRAMUSCULAR | Status: DC | PRN
  Administered 2017-10-08: 4 mg via INTRAVENOUS

## 2017-10-08 MED ORDER — MIDAZOLAM HCL 2 MG/2ML IJ SOLN
INTRAMUSCULAR | Status: AC
  Filled 2017-10-08: qty 2

## 2017-10-08 MED ORDER — FENTANYL CITRATE (PF) 250 MCG/5ML IJ SOLN
INTRAMUSCULAR | Status: AC
  Filled 2017-10-08: qty 5

## 2017-10-08 MED ORDER — BELLADONNA ALKALOIDS-OPIUM 16.2-60 MG RE SUPP: 1.0000 | Freq: Four times a day (QID) | RECTAL | Status: DC | PRN

## 2017-10-08 MED ORDER — ONDANSETRON HCL 4 MG/2ML IJ SOLN: 4.0000 mg | Freq: Four times a day (QID) | INTRAMUSCULAR | Status: DC | PRN

## 2017-10-08 MED ORDER — HYDRALAZINE HCL 20 MG/ML IJ SOLN
INTRAMUSCULAR | Status: AC
  Filled 2017-10-08: qty 1

## 2017-10-08 MED ORDER — DIPHENHYDRAMINE HCL 50 MG/ML IJ SOLN: 12.5000 mg | Freq: Four times a day (QID) | INTRAMUSCULAR | Status: DC | PRN

## 2017-10-08 MED ORDER — SODIUM CHLORIDE 0.9 % IV SOLN
INTRAVENOUS | Status: DC
  Administered 2017-10-08 – 2017-10-09 (×2): via INTRAVENOUS

## 2017-10-08 MED ORDER — OXYCODONE HCL 5 MG PO TABS
5.0000 mg | ORAL_TABLET | ORAL | Status: DC | PRN
  Administered 2017-10-08 – 2017-10-09 (×4): 5 mg via ORAL
  Filled 2017-10-08 (×4): qty 1

## 2017-10-08 MED ORDER — SODIUM CHLORIDE 0.9 % IR SOLN
Status: DC | PRN
  Administered 2017-10-08: 21000 mL

## 2017-10-08 MED ORDER — HYDROMORPHONE HCL 1 MG/ML IJ SOLN
0.2500 mg | INTRAMUSCULAR | Status: DC | PRN
Start: 2017-10-08 — End: 2017-10-08
  Administered 2017-10-08 (×2): 0.5 mg via INTRAVENOUS

## 2017-10-08 MED ORDER — GABAPENTIN 300 MG PO CAPS
300.0000 mg | ORAL_CAPSULE | Freq: Two times a day (BID) | ORAL | Status: DC
  Administered 2017-10-08 – 2017-10-09 (×3): 300 mg via ORAL
  Filled 2017-10-08 (×3): qty 1

## 2017-10-08 MED ORDER — DIPHENHYDRAMINE HCL 12.5 MG/5ML PO ELIX: 12.5000 mg | ORAL_SOLUTION | Freq: Four times a day (QID) | ORAL | Status: DC | PRN

## 2017-10-08 MED ORDER — ROSUVASTATIN CALCIUM 10 MG PO TABS
10.0000 mg | ORAL_TABLET | Freq: Every day | ORAL | Status: DC
  Administered 2017-10-08: 10 mg via ORAL
  Filled 2017-10-08: qty 1

## 2017-10-08 MED ORDER — CEFAZOLIN SODIUM-DEXTROSE 2-4 GM/100ML-% IV SOLN: 2.0000 g | INTRAVENOUS | Status: DC

## 2017-10-08 MED ORDER — METOPROLOL TARTRATE 25 MG PO TABS
25.0000 mg | ORAL_TABLET | Freq: Two times a day (BID) | ORAL | Status: DC
  Administered 2017-10-08 – 2017-10-09 (×3): 25 mg via ORAL
  Filled 2017-10-08 (×3): qty 1

## 2017-10-08 MED ORDER — LIDOCAINE 2% (20 MG/ML) 5 ML SYRINGE
INTRAMUSCULAR | Status: DC | PRN
  Administered 2017-10-08: 60 mg via INTRAVENOUS

## 2017-10-08 MED ORDER — OXYBUTYNIN CHLORIDE 5 MG PO TABS
5.0000 mg | ORAL_TABLET | Freq: Three times a day (TID) | ORAL | Status: DC | PRN
Start: 2017-10-08 — End: 2017-10-09
  Administered 2017-10-08 – 2017-10-09 (×2): 5 mg via ORAL
  Filled 2017-10-08 (×2): qty 1

## 2017-10-08 MED ORDER — DEXAMETHASONE SODIUM PHOSPHATE 10 MG/ML IJ SOLN
INTRAMUSCULAR | Status: DC | PRN
  Administered 2017-10-08: 10 mg via INTRAVENOUS

## 2017-10-08 MED ORDER — MIDAZOLAM HCL 2 MG/2ML IJ SOLN
INTRAMUSCULAR | Status: DC | PRN
  Administered 2017-10-08: 2 mg via INTRAVENOUS

## 2017-10-08 MED ORDER — HYDRALAZINE HCL 20 MG/ML IJ SOLN
10.0000 mg | Freq: Once | INTRAMUSCULAR | Status: AC
  Administered 2017-10-08: 10 mg via INTRAVENOUS

## 2017-10-08 MED ORDER — HYDROMORPHONE HCL 1 MG/ML IJ SOLN
INTRAMUSCULAR | Status: AC
  Filled 2017-10-08: qty 1

## 2017-10-08 MED ORDER — ATORVASTATIN CALCIUM 40 MG PO TABS: 80.0000 mg | ORAL_TABLET | Freq: Every day | ORAL | Status: DC

## 2017-10-08 MED ORDER — PROPOFOL 10 MG/ML IV BOLUS
INTRAVENOUS | Status: AC
  Filled 2017-10-08: qty 20

## 2017-10-08 MED ORDER — ACETAMINOPHEN 325 MG PO TABS
650.0000 mg | ORAL_TABLET | ORAL | Status: DC | PRN
  Administered 2017-10-08 – 2017-10-09 (×2): 650 mg via ORAL
  Filled 2017-10-08 (×2): qty 2

## 2017-10-08 MED ORDER — EPHEDRINE SULFATE-NACL 50-0.9 MG/10ML-% IV SOSY
PREFILLED_SYRINGE | INTRAVENOUS | Status: DC | PRN
  Administered 2017-10-08: 5 mg via INTRAVENOUS

## 2017-10-08 MED ORDER — LACTATED RINGERS IV SOLN
INTRAVENOUS | Status: DC
  Administered 2017-10-08 (×2): via INTRAVENOUS
  Administered 2017-10-08: 1000 mL via INTRAVENOUS

## 2017-10-08 MED ORDER — SODIUM CHLORIDE 0.9 % IR SOLN
3000.0000 mL | Status: DC
  Administered 2017-10-08 (×2): 3000 mL

## 2017-10-08 MED ORDER — METOPROLOL TARTRATE 25 MG PO TABS
25.0000 mg | ORAL_TABLET | Freq: Once | ORAL | Status: AC
  Administered 2017-10-08: 25 mg via ORAL
  Filled 2017-10-08: qty 1

## 2017-10-08 SURGICAL SUPPLY — 19 items
BAG URINE DRAINAGE (UROLOGICAL SUPPLIES) ×3 IMPLANT
BAG URO CATCHER STRL LF (MISCELLANEOUS) ×3 IMPLANT
CATH HEMA 3WAY 30CC 22FR COUDE (CATHETERS) ×1 IMPLANT
CATH HEMA 3WAY 30CC 24FR COUDE (CATHETERS) ×2 IMPLANT
CLOTH BEACON ORANGE TIMEOUT ST (SAFETY) ×3 IMPLANT
COVER FOOTSWITCH UNIV (MISCELLANEOUS) ×3 IMPLANT
ELECT REM PT RETURN 15FT ADLT (MISCELLANEOUS) ×1 IMPLANT
GLOVE BIOGEL M STRL SZ7.5 (GLOVE) ×3 IMPLANT
GOWN STRL REUS W/TWL LRG LVL3 (GOWN DISPOSABLE) ×3 IMPLANT
GOWN STRL REUS W/TWL XL LVL3 (GOWN DISPOSABLE) ×3 IMPLANT
HOLDER FOLEY CATH W/STRAP (MISCELLANEOUS) ×3 IMPLANT
LOOP CUT BIPOLAR 24F LRG (ELECTROSURGICAL) ×3 IMPLANT
MANIFOLD NEPTUNE II (INSTRUMENTS) ×3 IMPLANT
PACK CYSTO (CUSTOM PROCEDURE TRAY) ×3 IMPLANT
SET ASPIRATION TUBING (TUBING) ×1 IMPLANT
SYRINGE IRR TOOMEY STRL 70CC (SYRINGE) ×3 IMPLANT
TUBING CONNECTING 10 (TUBING) ×2 IMPLANT
TUBING CONNECTING 10' (TUBING) ×1
TUBING UROLOGY SET (TUBING) ×1 IMPLANT

## 2017-10-08 NOTE — H&P (Signed)
CC: I have symptoms of an enlarged prostate.  HPI: Jesse Petersen is a 72 year-old male established patient who is here for symptoms of enlarged prostate.  He first noticed the symptoms approximately 08/01/2015. His symptoms have gotten worse over the last year. He has been treated with Flomax. The patient has never had a surgical procedure for bladder outlet obstruction to his prostate.   He usually gets up at night to urinate 5 or more times. He does have to wait a long time to start his urinary stream. He does have to strain or bear down to start his urinary stream. He does not have a good size and strength to his urinary stream. He does dribble at the end of urination. He is having problems with emptying his bladder well.   His urine has not shut off completely. He has not previously had an indwelling catheter in for more than two weeks at a time.   He has had a PSA done.   08/23/17  PSA was 1.18 on 05/02/2017. Patient has nocturia 7, frequency, intermittency, difficulty initiating stream, straining to void, incomplete emptying, weak stream. He currently takes Flomax 0.8 mg daily.   09/25/17  The patient has been taking finasteride and Flomax 0.8 mg daily. He continues to have significant voiding complaints. He presents today for cystoscopy. He continues to have incomplete emptying, nocturia 4-5 times a night, weak stream, post void dribbling, intermittency, hesitancy.     ALLERGIES: No Allergies    MEDICATIONS: Finasteride 5 mg tablet 1 tablet PO Daily  Metoprolol Tartrate 25 mg tablet  Tamsulosin Hcl 0.4 mg capsule  Amlodipine Besylate 10 mg tablet  Aspirin Ec 81 mg tablet, delayed release  Colchicine 0.6 mg capsule  Gabapentin 300 mg capsule  Rosuvastatin Calcium 10 mg tablet  Sildenafil 20 mg tablet 1-5 tablets as needed     GU PSH: No GU PSH    NON-GU PSH: No Non-GU PSH    GU PMH: BPH w/LUTS - 08/23/2017 ED due to arterial insufficiency - 08/23/2017 Nocturia -  08/23/2017 Straining on Urination - 08/23/2017 Weak Urinary Stream - 08/23/2017    NON-GU PMH: Arthritis Diabetes Type 2 Gout Hypertension Myocardial Infarction Other heart failure    FAMILY HISTORY: No Family History     Notes: 4 sons; 1 daughter   SOCIAL HISTORY: Marital Status: Divorced Preferred Language: English; Ethnicity: Not Hispanic Or Latino; Race: Black or African American Current Smoking Status: Patient has never smoked.   Tobacco Use Assessment Completed: Used Tobacco in last 30 days? Has never drank.  Drinks 2 caffeinated drinks per day. Patient's occupation is/was Retired.    REVIEW OF SYSTEMS:    GU Review Male:   Patient denies frequent urination, hard to postpone urination, burning/ pain with urination, get up at night to urinate, leakage of urine, stream starts and stops, trouble starting your stream, have to strain to urinate , erection problems, and penile pain.  Gastrointestinal (Upper):   Patient denies nausea, vomiting, and indigestion/ heartburn.  Gastrointestinal (Lower):   Patient denies constipation and diarrhea.  Constitutional:   Patient denies fever, night sweats, weight loss, and fatigue.  Skin:   Patient denies skin rash/ lesion and itching.  Eyes:   Patient denies blurred vision and double vision.  Ears/ Nose/ Throat:   Patient denies sore throat and sinus problems.  Hematologic/Lymphatic:   Patient denies swollen glands and easy bruising.  Cardiovascular:   Patient denies leg swelling and chest pains.  Respiratory:   Patient  denies cough and shortness of breath.  Endocrine:   Patient denies excessive thirst.  Musculoskeletal:   Patient denies back pain and joint pain.  Neurological:   Patient denies headaches and dizziness.  Psychologic:   Patient denies depression and anxiety.   VITAL SIGNS:      09/25/2017 11:03 AM  BP 201/102 mmHg  Heart Rate 59 /min  Temperature 97.7 F / 36.5 C   MULTI-SYSTEM PHYSICAL EXAMINATION:    Constitutional:  Well-nourished. No physical deformities. Normally developed. Good grooming.  Respiratory: No labored breathing, no use of accessory muscles. Normal breath sounds.  Cardiovascular: Normal temperature, adequate peripheral perfusion of extremities  Skin: No paleness, no jaundice  Neurologic / Psychiatric: Oriented to time, oriented to place, oriented to person. No depression, no anxiety, no agitation.  Gastrointestinal: No mass, no tenderness, no rigidity, non obese abdomen.  Eyes: Normal conjunctivae. Normal eyelids.  Musculoskeletal: Normal gait and station of head and neck.     PAST DATA REVIEWED:  Source Of History:  Patient   PROCEDURES:         Flexible Cystoscopy - 52000  Risks, benefits, and some of the potential complications of the procedure were discussed at length with the patient including infection, bleeding, voiding discomfort, urinary retention, fever, chills, sepsis, and others. All questions were answered. Informed consent was obtained. Antibiotic prophylaxis was given. Sterile technique and intraurethral analgesia were used.  Meatus:  Normal size. Normal location. Normal condition.  Urethra:  No strictures.  External Sphincter:  Normal.  Verumontanum:  Normal.  Prostate:  Obstructing. Moderate hyperplasia. 3 cm, no significant median lobe  Bladder Neck:  Non-obstructing.  Ureteral Orifices:  Normal location. Normal size. Normal shape.   Bladder:  Moderate trabeculation. No tumors. Normal mucosa. No stones.      The lower urinary tract was carefully examined. The procedure was well-tolerated and without complications. Antibiotic instructions were given. Instructions were given to call the office immediately for bloody urine, difficulty urinating, urinary retention, painful or frequent urination, fever, chills, nausea, vomiting or other illness. The patient stated that he understood these instructions and would comply with them.          Flow Rate - 51741  Voided Volume:  58 cc  Time of Peak Flow: 0:04 min:sec  Average Flow Rate: 3 cc/sec  Peak Flow Rate: 3 cc/sec  Flow Time: 0:17 min:sec  Total Void Time: 0:20 min:sec           PVR Ultrasound - 54098  Scanned Volume: 193 cc         Urinalysis Dipstick Dipstick Cont'd  Color: Yellow Bilirubin: Neg  Appearance: Clear Ketones: Neg  Specific Gravity: 1.020 Blood: Neg  pH: 5.5 Protein: Trace  Glucose: Neg Urobilinogen: 0.2    Nitrites: Neg    Leukocyte Esterase: Neg    ASSESSMENT:      ICD-10 Details  1 GU:   BPH w/LUTS - N40.1   2   Nocturia - R35.1   3   Straining on Urination - R39.16   4   Weak Urinary Stream - R39.12    PLAN:           Document Letter(s):  Created for Patient: Clinical Summary         Notes:   The patient has failed medical management for his lower urinary tract symptoms. He would like to proceed with surgical resection. I discussed bipolar transurethral resection of the prostate. I specifically discussed the risks including but not limited  to bleeding which could require blood transfusion, infection, and injury to surrounding structures. Also discussed the possibility that the surgery would not improve symptoms though most men have a great improvement in their symptoms. Also discussed the low likelihood but possibility of development of new symptoms such as irritative voiding symptoms or urinary incontinence. Most men will have some degree of urinary urgency and discomfort immediately following the surgery that resolves in a short amount of time. He understands that most often this is an outpatient procedure but occasionally patients require hospitalization for continuous bladder irrigation in the event of excess bleeding. He also understands the possibility of being sent home with a urethral catheter. The patient expressed understanding and is eager to proceed.    CC: Ralene Okoy Moreira, MD    CARE TEAM: Modena SlaterEugene Bell, III, M.D. Whitney Early Tawnya CrookErica Wheless     Signed by  Modena SlaterEugene Bell, III, M.D. on 09/25/17 at 12:40 PM

## 2017-10-08 NOTE — Op Note (Signed)
Preoperative diagnosis: 1. Bladder outlet obstruction secondary to BPH  Postoperative diagnosis:  1. Bladder outlet obstruction secondary to BPH  Procedure:  1. Cystoscopy 2. Transurethral resection of the prostate  Surgeon: Ray ChurchEugene D Ison Wichmann, III. M.D.  Anesthesia: General  Complications: None  EBL: Minimal  Specimens: 1. Prostate chips  Indication: Jesse Petersen is a patient with bladder outlet obstruction secondary to benign prostatic hyperplasia. After reviewing the management options for treatment, he elected to proceed with the above surgical procedure(s). We have discussed the potential benefits and risks of the procedure, side effects of the proposed treatment, the likelihood of the patient achieving the goals of the procedure, and any potential problems that might occur during the procedure or recuperation. Informed consent has been obtained.  Description of procedure:  The patient was taken to the operating room and general anesthesia was induced.  The patient was placed in the dorsal lithotomy position, prepped and draped in the usual sterile fashion, and preoperative antibiotics were administered. A preoperative time-out was performed.   Cystourethroscopy was performed.  The patient's urethra was examined and the anterior urethra was normal.  There were no strictures.  The prostatic urethra was approximately 3-4 cm with bilobar hypertrophy with a high riding median lobe but no significant intravesical median lobe.  The bladder was then systematically examined in its entirety. There was no evidence of any bladder tumors, stones, or other mucosal pathology except he had moderate to severe trabeculations.  I was able to easily identify the left ureteral orifice which was well away from the bladder neck.  Due to trabeculations, I did not see the right ureteral orifice.  The areas of resection appeared to be far away from the area of the orifice however.  The prostate adenoma was  then resected utilizing loop cautery resection with the bipolar cutting loop.  The prostate adenoma from the bladder neck back to the verumontanum was resected beginning at the six o'clock position and then extended to include the right and left lobes of the prostate and anterior prostate. Care was taken not to resect distal to the verumontanum.  Hemostasis was then achieved with the cautery and the bladder was emptied and reinspected with no significant bleeding noted at the end of the procedure.    A 24 French hematuria 3 way catheter was then placed into the bladder and continuous bladder irrigation was initiated.  The patient appeared to tolerate the procedure well and without complications.  The patient was able to be awakened and transferred to the recovery unit in satisfactory condition.

## 2017-10-08 NOTE — Progress Notes (Addendum)
Received pt from PACU in stable condition, pt CBI patent with clear yellow return, MD assessed, no new changes. Pain med given as order for pressure and spasms. Will cont to monitor. SRP, RN

## 2017-10-08 NOTE — Transfer of Care (Signed)
Immediate Anesthesia Transfer of Care Note  Patient: Asencion GowdaJames Guldin  Procedure(s) Performed: TRANSURETHRAL RESECTION OF THE PROSTATE (TURP) (N/A )  Patient Location: PACU  Anesthesia Type:General  Level of Consciousness: sedated  Airway & Oxygen Therapy: Patient Spontanous Breathing and Patient connected to face mask oxygen  Post-op Assessment: Report given to RN and Post -op Vital signs reviewed and stable  Post vital signs: Reviewed and stable  Last Vitals:  Vitals:   10/08/17 0959 10/08/17 1041  BP: (!) 182/110 (!) 176/111  Pulse: 87   Resp: 18   Temp: 36.8 C   SpO2: 100%     Last Pain:  Vitals:   10/08/17 0959  TempSrc: Oral      Patients Stated Pain Goal: 4 (10/08/17 1041)  Complications: No apparent anesthesia complications

## 2017-10-08 NOTE — Anesthesia Procedure Notes (Signed)
Procedure Name: LMA Insertion Date/Time: 10/08/2017 12:39 PM Performed by: Minerva EndsMirarchi, Amika Tassin M, CRNA Pre-anesthesia Checklist: Patient identified, Emergency Drugs available, Suction available and Patient being monitored Patient Re-evaluated:Patient Re-evaluated prior to induction Oxygen Delivery Method: Circle System Utilized Preoxygenation: Pre-oxygenation with 100% oxygen Induction Type: IV induction Ventilation: Mask ventilation without difficulty LMA: LMA inserted and LMA with gastric port inserted LMA Size: 5.0 Number of attempts: 1 Placement Confirmation: positive ETCO2 Tube secured with: Tape Dental Injury: Teeth and Oropharynx as per pre-operative assessment  Comments: Smooth Iv induction hodierene--- LMA insertion assisted by Hodierne-- atraumatic-- teeth and mouth as preop bilat BS

## 2017-10-08 NOTE — Interval H&P Note (Signed)
History and Physical Interval Note:  10/08/2017 10:50 AM  Jesse Petersen  has presented today for surgery, with the diagnosis of benign prostate hyperplasia  The various methods of treatment have been discussed with the patient and family. After consideration of risks, benefits and other options for treatment, the patient has consented to  Procedure(s): TRANSURETHRAL RESECTION OF THE PROSTATE (TURP) (N/A) as a surgical intervention .  The patient's history has been reviewed, patient examined, no change in status, stable for surgery.  I have reviewed the patient's chart and labs.  Questions were answered to the patient's satisfaction.     Ray ChurchEugene D Bell, III

## 2017-10-08 NOTE — Anesthesia Procedure Notes (Signed)
Date/Time: 10/08/2017 1:42 PM Performed by: Minerva EndsMirarchi, Yacine Droz M, CRNA Oxygen Delivery Method: Simple face mask Placement Confirmation: positive ETCO2 and breath sounds checked- equal and bilateral Dental Injury: Teeth and Oropharynx as per pre-operative assessment

## 2017-10-08 NOTE — Anesthesia Preprocedure Evaluation (Addendum)
Anesthesia Evaluation  Patient identified by MRN, date of birth, ID band Patient awake    Reviewed: Allergy & Precautions, H&P , NPO status , Patient's Chart, lab work & pertinent test results  Airway Mallampati: II   Neck ROM: full    Dental  (+) Dental Advisory Given, Poor Dentition, Missing, Chipped   Pulmonary neg pulmonary ROS,    breath sounds clear to auscultation       Cardiovascular hypertension, + CAD and + Past MI   Rhythm:regular Rate:Normal     Neuro/Psych    GI/Hepatic (+) Hepatitis -, C  Endo/Other  diabetes, Type 2  Renal/GU Renal InsufficiencyRenal disease     Musculoskeletal  (+) Arthritis ,   Abdominal   Peds  Hematology   Anesthesia Other Findings   Reproductive/Obstetrics                            Anesthesia Physical Anesthesia Plan  ASA: III  Anesthesia Plan: General   Post-op Pain Management:    Induction: Intravenous  PONV Risk Score and Plan: 2 and Ondansetron, Dexamethasone and Treatment may vary due to age or medical condition  Airway Management Planned: LMA  Additional Equipment:   Intra-op Plan:   Post-operative Plan:   Informed Consent: I have reviewed the patients History and Physical, chart, labs and discussed the procedure including the risks, benefits and alternatives for the proposed anesthesia with the patient or authorized representative who has indicated his/her understanding and acceptance.     Plan Discussed with: CRNA, Anesthesiologist and Surgeon  Anesthesia Plan Comments:         Anesthesia Quick Evaluation

## 2017-10-08 NOTE — Progress Notes (Signed)
Pt takes metoprolol bid 25 mg, did not take this morning. Dr. Chaney MallingHodierne informed and gave verbal order for 25 mg metoprolol. Notified of htn.

## 2017-10-09 ENCOUNTER — Encounter (HOSPITAL_COMMUNITY): Payer: Self-pay | Admitting: Urology

## 2017-10-09 DIAGNOSIS — N401 Enlarged prostate with lower urinary tract symptoms: Secondary | ICD-10-CM | POA: Diagnosis not present

## 2017-10-09 LAB — HEMOGLOBIN AND HEMATOCRIT, BLOOD
HEMATOCRIT: 40.9 % (ref 39.0–52.0)
HEMOGLOBIN: 13.6 g/dL (ref 13.0–17.0)

## 2017-10-09 MED ORDER — TRAMADOL HCL 50 MG PO TABS: 50.0000 mg | ORAL_TABLET | Freq: Four times a day (QID) | ORAL | 0 refills | Status: AC | PRN

## 2017-10-09 NOTE — Progress Notes (Signed)
Pt discharged instruction reviewed with pt, acknowledged understanding. IV d/c, denies pain at current time. SRP, RN

## 2017-10-09 NOTE — Discharge Instructions (Signed)

## 2017-10-09 NOTE — Discharge Summary (Signed)
Physician Discharge Summary  Patient ID: Asencion GowdaJames Sgroi MRN: [id number] DOB/AGE: [id number] 72 y.o.  Admit date: 10/08/2017 Discharge date: 10/09/2017  Admission Diagnoses:  Discharge Diagnoses:  Active Problems:   BPH (benign prostatic hyperplasia)   Discharged Condition: good  Hospital Course: Patient underwent a TURP on 10/08/2017.  He remained overnight for observation and continuous bladder irrigation which was weaned off the following morning and he underwent a voiding trial.  He is stable for discharge on postoperative day 1  Consults: None  Significant Diagnostic Studies: None  Treatments: surgery: TURP  Discharge Exam: Blood pressure (!) 154/94, pulse 75, temperature 98.2 F (36.8 C), temperature source Oral, resp. rate 20, height 6\' 2"  (1.88 m), weight 106.6 kg (235 lb), SpO2 100 %. General appearance: alert no acute distress Adequate peripheral perfusion Foley draining yellow urine Nonlabored respirations, symmetrical chest rise Abdomen soft nontender nondistended  Disposition: 01-Home or Self Care   Allergies as of 10/09/2017   No Known Allergies     Medication List    STOP taking these medications   finasteride 5 MG tablet Commonly known as:  PROSCAR   tamsulosin 0.4 MG Caps capsule Commonly known as:  FLOMAX     TAKE these medications   allopurinol 100 MG tablet Commonly known as:  ZYLOPRIM Take 100 mg by mouth daily.   aspirin EC 81 MG tablet Take 81 mg by mouth daily.   chlorpheniramine 4 MG tablet Commonly known as:  CHLOR-TRIMETON Take 4 mg by mouth daily.   colchicine 0.6 MG tablet Take 0.6 mg by mouth 2 (two) times daily.   fluticasone 50 MCG/ACT nasal spray Commonly known as:  FLONASE Place 1 spray into both nostrils daily as needed for allergies or rhinitis.   gabapentin 300 MG capsule Commonly known as:  NEURONTIN Take 300 mg by mouth 2 (two) times daily.   metoprolol tartrate 25 MG tablet Commonly known as:  LOPRESSOR TAKE  ONE TABLET BY MOUTH TWICE DAILY   multivitamin with minerals Tabs tablet Take 1 tablet by mouth daily.   nitroGLYCERIN 0.4 MG SL tablet Commonly known as:  NITROSTAT Place 1 tablet (0.4 mg total) under the tongue every 5 (five) minutes as needed for chest pain.   rosuvastatin 10 MG tablet Commonly known as:  CRESTOR Take 10 mg by mouth daily.   traMADol 50 MG tablet Commonly known as:  ULTRAM Take 1 tablet (50 mg total) by mouth every 6 (six) hours as needed.        Signed: Ray ChurchEugene D Michelina Mexicano, III 10/09/2017, 7:28 AM

## 2017-10-09 NOTE — Progress Notes (Signed)
Updated MD concerning voiding trial and post void residual after bladder scan, pt ok  For discharge.  I/O documented in flow sheet. SRP, RN

## 2017-10-11 NOTE — Anesthesia Postprocedure Evaluation (Signed)
Anesthesia Post Note  Patient: Jesse Petersen  Procedure(Petersen) Performed: TRANSURETHRAL RESECTION OF THE PROSTATE (TURP) (N/A )     Patient location during evaluation: PACU Anesthesia Type: General Level of consciousness: awake and alert Pain management: pain level controlled Vital Signs Assessment: post-procedure vital signs reviewed and stable Respiratory status: spontaneous breathing, nonlabored ventilation, respiratory function stable and patient connected to nasal cannula oxygen Cardiovascular status: blood pressure returned to baseline and stable Postop Assessment: no apparent nausea or vomiting Anesthetic complications: no    Last Vitals:  Vitals:   10/09/17 0501 10/09/17 0847  BP: (!) 154/94 (!) 163/97  Pulse: 75 76  Resp: 20 19  Temp: 36.8 C 36.9 C  SpO2: 100% 98%    Last Pain:  Vitals:   10/09/17 0937  TempSrc:   PainSc: 5                  Jesse Petersen

## 2018-05-29 ENCOUNTER — Ambulatory Visit (INDEPENDENT_AMBULATORY_CARE_PROVIDER_SITE_OTHER): Payer: Medicare HMO

## 2018-05-29 ENCOUNTER — Ambulatory Visit (INDEPENDENT_AMBULATORY_CARE_PROVIDER_SITE_OTHER): Payer: Medicare HMO | Admitting: Orthopedic Surgery

## 2018-05-29 ENCOUNTER — Encounter (INDEPENDENT_AMBULATORY_CARE_PROVIDER_SITE_OTHER): Payer: Self-pay | Admitting: Orthopedic Surgery

## 2018-05-29 DIAGNOSIS — M25561 Pain in right knee: Secondary | ICD-10-CM | POA: Diagnosis not present

## 2018-05-29 DIAGNOSIS — M1711 Unilateral primary osteoarthritis, right knee: Secondary | ICD-10-CM

## 2018-05-31 ENCOUNTER — Encounter (INDEPENDENT_AMBULATORY_CARE_PROVIDER_SITE_OTHER): Payer: Self-pay | Admitting: Orthopedic Surgery

## 2018-05-31 NOTE — Progress Notes (Signed)
Office Visit Note   Patient: Jesse Petersen           Date of Birth: [birth year]           MRN: [id number] Visit Date: 05/29/2018 Requested by: Ralene Ok, MD [id number] [place], [place] [address] PCP: Ralene Ok, MD  Subjective: Chief Complaint  Patient presents with  . Right Knee - Pain    HPI: Jesse Petersen is a patient with right knee pain several months duration with no known injury.  States he has good and bad days.  Pain comes and goes.  States that posteriorly the knee feels swollen at times.  He is retired.  He does occasional physical work.  Most of the pain is on the medial side but he denies any mechanical symptoms.  Takes no medications except for occasional Motrin.              ROS: All systems reviewed are negative as they relate to the chief complaint within the history of present illness.  Patient denies  fevers or chills.   Assessment & Plan: Visit Diagnoses:  1. Right knee pain, unspecified chronicity   2. Unilateral primary osteoarthritis, right knee     Plan: Impression is right knee pain very sporadic in nature with no mechanical symptoms and no effusion today.  He does have some patellofemoral arthritis.  We talked about an injection but is not really symptomatic enough for that yet.  I do not think he has any discrete unstable meniscal flaps present.  I will try injection first before any type of other imaging.  He will come back when his symptoms warrant that.  I will see him back as needed  Follow-Up Instructions: Return if symptoms worsen or fail to improve.   Orders:  Orders Placed This Encounter  Procedures  . XR KNEE 3 VIEW RIGHT   No orders of the defined types were placed in this encounter.     Procedures: No procedures performed   Clinical Data: No additional findings.  Objective: Vital Signs: There were no vitals taken for this visit.  Physical Exam:   Constitutional: Patient appears well-developed HEENT:  Head:  Normocephalic Eyes:EOM are normal Neck: Normal range of motion Cardiovascular: Normal rate Pulmonary/chest: Effort normal Neurologic: Patient is alert Skin: Skin is warm Psychiatric: Patient has normal mood and affect    Ortho Exam: Ortho exam demonstrates normal gait alignment with normal range of motion.  No effusion is present in the right knee.  Does have some patellofemoral crepitus but stable collateral cruciate ligaments.  Pedal pulses palpable.  No masses lymphadenopathy or skin changes noted in the knee particularly in the posterior aspect of the knee  Specialty Comments:  No specialty comments available.  Imaging: No results found.   PMFS History: Patient Active Problem List   Diagnosis Date Noted  . BPH (benign prostatic hyperplasia) 10/08/2017  . CAD (coronary artery disease) of artery bypass graft 06/11/2013  . Non-ST elevation myocardial infarction (NSTEMI), initial care episode (HCC) 07/09/2012  . Fever in adult 07/09/2012  . Diabetes mellitus without complication (HCC)   . Hypertension   . High cholesterol   . Chronic kidney disease, stage III (moderate) (HCC)   . Gout    Past Medical History:  Diagnosis Date  . Arthritis    mild  . CAD (coronary artery disease)    a. NSTEMI 12/13:  LHC 07/08/12: mDx 30%, inferior subbranch of the CFX 40-50%, mRCA 30%, very small-caliber subbranch off the distal  PL branch 99% (small)-culprit vessel-2 small for PCI. Medical therapy recommended  . Chronic kidney disease, stage III (moderate) (HCC)    pt. denies  . Diabetes mellitus without complication (HCC)    type 2 no meds  . Gout   . Hepatitis    Hep C  . High cholesterol   . Hx of echocardiogram    a. Echo 07/07/12:  Mild LVH, EF 60-65%, Gr 1 diast dysfn, mildly dilated Ao root (38 mm), Tr MR, mild LAE, PASP 23.  Marland Kitchen Hypertension   . NSTEMI (non-ST elevated myocardial infarction) (HCC)    mild 2013  no stents    History reviewed. No pertinent family history.  Past  Surgical History:  Procedure Laterality Date  . LEFT HEART CATH    . LEFT HEART CATHETERIZATION WITH CORONARY ANGIOGRAM N/A 07/08/2012   Procedure: LEFT HEART CATHETERIZATION WITH CORONARY ANGIOGRAM;  Surgeon: Kathleene Hazel, MD;  Location: [facility] CATH LAB;  Service: Cardiovascular;  Laterality: N/A;  . select coronary  angiography    . TRANSURETHRAL RESECTION OF PROSTATE     Dr. Alvester Morin 10-08-17  . TRANSURETHRAL RESECTION OF PROSTATE N/A 10/08/2017   Procedure: TRANSURETHRAL RESECTION OF THE PROSTATE (TURP);  Surgeon: Crista Elliot, MD;  Location: [facility] ORS;  Service: Urology;  Laterality: N/A;   Social History   Occupational History  . Occupation: retired  Tobacco Use  . Smoking status: Never Smoker  . Smokeless tobacco: Never Used  Substance and Sexual Activity  . Alcohol use: No    Frequency: Never  . Drug use: Yes    Types: Marijuana  . Sexual activity: Yes    Partners: Female

## 2018-09-04 ENCOUNTER — Encounter (INDEPENDENT_AMBULATORY_CARE_PROVIDER_SITE_OTHER): Payer: Self-pay | Admitting: Orthopedic Surgery

## 2018-09-04 ENCOUNTER — Ambulatory Visit (INDEPENDENT_AMBULATORY_CARE_PROVIDER_SITE_OTHER): Payer: Medicare HMO | Admitting: Orthopedic Surgery

## 2018-09-04 DIAGNOSIS — M25561 Pain in right knee: Secondary | ICD-10-CM

## 2018-09-05 ENCOUNTER — Encounter (INDEPENDENT_AMBULATORY_CARE_PROVIDER_SITE_OTHER): Payer: Self-pay | Admitting: Orthopedic Surgery

## 2018-09-05 NOTE — Progress Notes (Signed)
Office Visit Note   Patient: Jesse Petersen           Date of Birth: [birth year]           MRN: [id number] Visit Date: 09/04/2018 Requested by: Ralene Ok, MD [id number] [facility] [address] [place], [place] [address] PCP: Ralene Ok, MD  Subjective: Chief Complaint  Patient presents with  . Right Knee - Pain    HPI: Jesse Petersen is a patient with right knee pain.  3 weeks ago he turned wrong he had a sharp stabbing pain.  It is been improving some but he localizes the pain primarily to the medial aspect.  Does not report a pop or fall.  Last radiographs done 1019 which showed fairly minimal arthritis.  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  He localizes pain at the medial joint line over the MCL.  He is able to walk with this.              ROS: All systems reviewed are negative as they relate to the chief complaint within the history of present illness.  Patient denies  fevers or chills.   Assessment & Plan: Visit Diagnoses:  1. Right knee pain, unspecified chronicity     Plan: Impression is right knee twisting injury with focal medial joint line tenderness but no effusion today.  Radiographs previously looked pretty reasonable.  Offered Suman an injection today but he wants to live with his symptoms.  In general there is nothing fracture and I think he is more the type of person he wants to wait things out.  If his symptoms recur or if swelling or mechanical symptoms develop I would favor an injection and then if that does not help MRI scanning to likely confirm what is a degenerative meniscal tear.  I think is possible this may get better on its own as well.  His choice is definitely to live with that until he can.  I will see him back as needed  Follow-Up Instructions: Return if symptoms worsen or fail to improve.   Orders:  No orders of the defined types were placed in this encounter.  No orders of the defined types were placed in this encounter.     Procedures: No procedures  performed   Clinical Data: No additional findings.  Objective: Vital Signs: There were no vitals taken for this visit.  Physical Exam:   Constitutional: Patient appears well-developed HEENT:  Head: Normocephalic Eyes:EOM are normal Neck: Normal range of motion Cardiovascular: Normal rate Pulmonary/chest: Effort normal Neurologic: Patient is alert Skin: Skin is warm Psychiatric: Patient has normal mood and affect    Ortho Exam: Ortho exam demonstrates full active and passive range of motion of the right knee with stable collateral crucial ligaments.  No effusion in the right knee.  Pedal pulse palpable.  He does have focal medial joint line tenderness right at that MCL region.  No other masses lymphadenopathy or skin change in the right knee region.  No groin pain with internal X rotation of the leg.  Specialty Comments:  No specialty comments available.  Imaging: No results found.   PMFS History: Patient Active Problem List   Diagnosis Date Noted  . BPH (benign prostatic hyperplasia) 10/08/2017  . CAD (coronary artery disease) of artery bypass graft 06/11/2013  . Non-ST elevation myocardial infarction (NSTEMI), initial care episode (HCC) 07/09/2012  . Fever in adult 07/09/2012  . Diabetes mellitus without complication (HCC)   . Hypertension   . High cholesterol   .  Chronic kidney disease, stage III (moderate) (HCC)   . Gout    Past Medical History:  Diagnosis Date  . Arthritis    mild  . CAD (coronary artery disease)    a. NSTEMI 12/13:  LHC 07/08/12: mDx 30%, inferior subbranch of the CFX 40-50%, mRCA 30%, very small-caliber subbranch off the distal PL branch 99% (small)-culprit vessel-2 small for PCI. Medical therapy recommended  . Chronic kidney disease, stage III (moderate) (HCC)    pt. denies  . Diabetes mellitus without complication (HCC)    type 2 no meds  . Gout   . Hepatitis    Hep C  . High cholesterol   . Hx of echocardiogram    a. Echo 07/07/12:   Mild LVH, EF 60-65%, Gr 1 diast dysfn, mildly dilated Ao root (38 mm), Tr MR, mild LAE, PASP 23.  Marland Kitchen. Hypertension   . NSTEMI (non-ST elevated myocardial infarction) (HCC)    mild 2013  no stents    History reviewed. No pertinent family history.  Past Surgical History:  Procedure Laterality Date  . LEFT HEART CATH    . LEFT HEART CATHETERIZATION WITH CORONARY ANGIOGRAM N/A 07/08/2012   Procedure: LEFT HEART CATHETERIZATION WITH CORONARY ANGIOGRAM;  Surgeon: Kathleene Hazelhristopher D McAlhany, MD;  [facility];  Service: Cardiovascular;  Laterality: N/A;  . select coronary  angiography    . TRANSURETHRAL RESECTION OF PROSTATE     Dr. Alvester MorinBell 10-08-17  . TRANSURETHRAL RESECTION OF PROSTATE N/A 10/08/2017   Procedure: TRANSURETHRAL RESECTION OF THE PROSTATE (TURP);  Surgeon: Crista ElliotBell, Eugene D III, MD;  Location: [facility];  Service: Urology;  Laterality: N/A;   Social History   Occupational History  . Occupation: retired  Tobacco Use  . Smoking status: Never Smoker  . Smokeless tobacco: Never Used  Substance and Sexual Activity  . Alcohol use: No    Frequency: Never  . Drug use: Yes    Types: Marijuana  . Sexual activity: Yes    Partners: Female

## 2020-10-29 DIAGNOSIS — N183 Chronic kidney disease, stage 3 unspecified: Secondary | ICD-10-CM

## 2020-10-29 HISTORY — DX: Chronic kidney disease, stage 3 unspecified: N18.30

## 2020-12-30 ENCOUNTER — Encounter: Payer: Self-pay | Admitting: Plastic Surgery

## 2020-12-30 ENCOUNTER — Ambulatory Visit (INDEPENDENT_AMBULATORY_CARE_PROVIDER_SITE_OTHER): Payer: Medicare Other | Admitting: Plastic Surgery

## 2020-12-30 ENCOUNTER — Other Ambulatory Visit: Payer: Self-pay

## 2020-12-30 VITALS — BP 161/90 | HR 82 | Ht 74.0 in | Wt 206.0 lb

## 2020-12-30 DIAGNOSIS — L723 Sebaceous cyst: Secondary | ICD-10-CM

## 2020-12-30 NOTE — Progress Notes (Signed)
Referring Provider Ralene Ok, MD [address] [place],  [place] [address]   CC:  Chief Complaint  Patient presents with  . consult      Jesse Petersen is an 75 y.o. male.  HPI: Patient presents to discuss 2 bothersome lesions.  He has 1 in his left lower back area that is been present for several years.  It has grown in size and has become firmer.  It is uncomfortable when he lies on it.  He did try to squeeze it in the past but does not recall getting much out of the way of drainage.  He also has a lesion in his central upper forehead that is painful to him.  Will intermittently drain thick material through it central punctum.  Its been present for quite a few years.  He would like both lesions to be removed.  No Known Allergies  Outpatient Encounter Medications as of 12/30/2020  Medication Sig Note  . allopurinol (ZYLOPRIM) 100 MG tablet Take 100 mg by mouth daily.   Marland Kitchen aspirin EC 81 MG tablet Take 81 mg by mouth daily.   . chlorpheniramine (CHLOR-TRIMETON) 4 MG tablet Take 4 mg by mouth daily.   . colchicine 0.6 MG tablet Take 0.6 mg by mouth 2 (two) times daily.   . fluticasone (FLONASE) 50 MCG/ACT nasal spray Place 1 spray into both nostrils daily as needed for allergies or rhinitis.   Marland Kitchen gabapentin (NEURONTIN) 300 MG capsule Take 300 mg by mouth 2 (two) times daily.   . metoprolol tartrate (LOPRESSOR) 25 MG tablet TAKE ONE TABLET BY MOUTH TWICE DAILY   . Multiple Vitamin (MULTIVITAMIN WITH MINERALS) TABS tablet Take 1 tablet by mouth daily.   . nitroGLYCERIN (NITROSTAT) 0.4 MG SL tablet Place 1 tablet (0.4 mg total) under the tongue every 5 (five) minutes as needed for chest pain. (Patient not taking: Reported on 12/30/2020) 10/08/2017: Hasnt taken   . rosuvastatin (CRESTOR) 10 MG tablet Take 10 mg by mouth daily.   . Telmisartan-amLODIPine 40-10 MG TABS Take 1 tablet by mouth daily.    No facility-administered encounter medications on file as of 12/30/2020.     Past Medical  History:  Diagnosis Date  . Arthritis    mild  . CAD (coronary artery disease)    a. NSTEMI 12/13:  LHC 07/08/12: mDx 30%, inferior subbranch of the CFX 40-50%, mRCA 30%, very small-caliber subbranch off the distal PL branch 99% (small)-culprit vessel-2 small for PCI. Medical therapy recommended  . Chronic kidney disease, stage III (moderate) (HCC)    pt. denies  . Diabetes mellitus without complication (HCC)    type 2 no meds  . Gout   . Hepatitis    Hep C  . High cholesterol   . Hx of echocardiogram    a. Echo 07/07/12:  Mild LVH, EF 60-65%, Gr 1 diast dysfn, mildly dilated Ao root (38 mm), Tr MR, mild LAE, PASP 23.  Marland Kitchen Hypertension   . NSTEMI (non-ST elevated myocardial infarction) (HCC)    mild 2013  no stents    Past Surgical History:  Procedure Laterality Date  . LEFT HEART CATH    . LEFT HEART CATHETERIZATION WITH CORONARY ANGIOGRAM N/A 07/08/2012   Procedure: LEFT HEART CATHETERIZATION WITH CORONARY ANGIOGRAM;  Surgeon: Kathleene Hazel, MD;  Location: [facility] CATH LAB;  Service: Cardiovascular;  Laterality: N/A;  . select coronary  angiography    . TRANSURETHRAL RESECTION OF PROSTATE     Dr. Alvester Morin 10-08-17  .  TRANSURETHRAL RESECTION OF PROSTATE N/A 10/08/2017   Procedure: TRANSURETHRAL RESECTION OF THE PROSTATE (TURP);  Surgeon: Crista Elliot, MD;  Location: [facility];  Service: Urology;  Laterality: N/A;    No family history on file.  Social History   Social History Narrative   Lives alone and is retired.     Review of Systems General: Denies fevers, chills, weight loss CV: Denies chest pain, shortness of breath, palpitations  Physical Exam Vitals with BMI 12/30/2020 10/09/2017 10/09/2017  Height 6\' 2"  - -  Weight 206 lbs - -  BMI 26.44 - -  Systolic 161 163  Diastolic 90 97 94  Pulse 82 76 75    General:  No acute distress,  Alert and oriented, Non-Toxic, Normal speech and affect Examination of the lower back shows a 10 cm firm subcutaneous mass with a  central scar which may correspond to a previous treatments or attempts at drainage.  In his central upper forehead there is a 2 cm subcutaneous mass with a central punctum that is consistent with a cyst.  Neither appears to be acutely inflamed.  Assessment/Plan Patient presents with 2 subcutaneous masses that I suspect are both epidermal cyst.  The one in his lower back is particularly large and we decided that excision of both of these in the operating room at one time would be the best option.  We discussed the risks of the excision that include bleeding, infection, damage to surrounding structures and need for additional procedures.  We discussed potential for recurrence but I suspect that will be unlikely.  We discussed the location and orientation of the scars.  All of his questions were answered and we will plan to move forward.  076 12/30/2020, 12:30 PM

## 2021-01-19 ENCOUNTER — Telehealth: Payer: Self-pay | Admitting: *Deleted

## 2021-01-19 NOTE — Telephone Encounter (Signed)
   [facility] Pre-operative Risk Assessment    Patient Name: Jesse Petersen  DOB: [birth year]  MRN: [id number]   HEARTCARE STAFF: - Please ensure there is not already an duplicate clearance open for this procedure. - Under Visit Info/Reason for Call, type in Other and utilize the format Clearance MM/DD/YY or Clearance TBD. Do not use dashes or single digits. - If request is for dental extraction, please clarify the # of teeth to be extracted. - If the patient is currently at the dentist's office, call Pre-Op APP to address. If the patient is not currently in the dentist office, please route to the Pre-Op pool  Request for surgical clearance: PT LAST SEEN BY CARDIOLOGY 2014. PT WILL NEED NEW PT APPT. MESSAGE SENT TO SCHEDULING TO MAKE NEW PT APPT; CLEARANCE NOTES HAVE BEEN HANDED OFF TO CHART PREP TEAM   What type of surgery is being performed? EXCISION CENTRAL FOREHEAD AND LEFT LOWER BACK MASSES   When is this surgery scheduled? TBD   What type of clearance is required (medical clearance vs. Pharmacy clearance to hold med vs. Both)? MEDICAL  Are there any medications that need to be held prior to surgery and how long? ASA    Practice name and name of physician performing surgery? [facility]; DR. Silverio [facility]   What is the office phone number? [phone]   7.   What is the office fax number? [phone]  8.   Anesthesia type (None, local, MAC, general) ? GENERAL    Julaine Hua 01/19/2021, 4:44 PM  _________________________________________________________________   (provider comments below)

## 2021-01-24 ENCOUNTER — Other Ambulatory Visit: Payer: Self-pay

## 2021-01-24 ENCOUNTER — Encounter: Payer: Self-pay | Admitting: Cardiology

## 2021-01-24 ENCOUNTER — Ambulatory Visit (INDEPENDENT_AMBULATORY_CARE_PROVIDER_SITE_OTHER): Payer: Medicare Other | Admitting: Cardiology

## 2021-01-24 VITALS — BP 150/86 | HR 88 | Ht 74.5 in | Wt 212.8 lb

## 2021-01-24 DIAGNOSIS — E1169 Type 2 diabetes mellitus with other specified complication: Secondary | ICD-10-CM

## 2021-01-24 DIAGNOSIS — I10 Essential (primary) hypertension: Secondary | ICD-10-CM

## 2021-01-24 DIAGNOSIS — I214 Non-ST elevation (NSTEMI) myocardial infarction: Secondary | ICD-10-CM

## 2021-01-24 DIAGNOSIS — Z01810 Encounter for preprocedural cardiovascular examination: Secondary | ICD-10-CM | POA: Diagnosis not present

## 2021-01-24 DIAGNOSIS — I2510 Atherosclerotic heart disease of native coronary artery without angina pectoris: Secondary | ICD-10-CM | POA: Diagnosis not present

## 2021-01-24 DIAGNOSIS — E785 Hyperlipidemia, unspecified: Secondary | ICD-10-CM

## 2021-01-24 NOTE — Progress Notes (Signed)
Primary Care Provider: Ralene Ok, MD Cardiologist: Bryan Lemma, MD Electrophysiologist: None  Clinic Note: Chief Complaint  Patient presents with   Preoperative Cardiovascular Evaluation    Has upcoming lipoma surgery and possible knee surgery. ->  Referred because of history of non-STEMI   Coronary Artery Disease    Non-STEMI back in 2013-no PCI because culprit lesion was a very small caliber PL 1.  Treated medically.  Negative Myoview in November 2014.  Has been lost to follow-up since then.    ===================================  ASSESSMENT/PLAN   Problem List Items Addressed This Visit     Preoperative cardiovascular examination    Jesse Petersen is doing remarkably well.  He has not had any cardiac symptoms since his non-STEMI back in 2013.  He had a Myoview done the following year which confirms no ischemia or infarction.  Preserved EF.  He is probably more active with exercising both walking and gym exercises now that he was at that time.   Is easily able to achieve more than a if not close to 10 METS Lipoma surgery is a very low risk surgery, and would not cause any untoward cardiovascular effect.  Based on the rEvised Cardiac Risk Index, he would be considered a Class I Risk-estimated 3.9% 30-day risk of death MI or cardiac arrest based on his medical history.  Ongoing treatment with longstanding beta-blocker reduces this risk by 50%.  In the absence of any active cardiac symptoms, no further testing is warranted.  It would not change management.  He is okay to proceed with surgery with Dr. Katy Apo with no further cardiovascular evaluation.  I did recommend that he increase his dose of metoprolol to 50 mg on the evening before and morning of surgery..         Coronary artery disease involving native coronary artery without angina pectoris - Primary (Chronic)    I reviewed his cath films, he had a very small caliber posterolateral branch with 99% stenosis.  I  suspect that vessel is probably occluded.  He has not had any further anginal symptoms, had a nonischemic Myoview 1 year later.  EF was normal on echo and Myoview.  Very active with no ongoing symptoms.  Plan: Continue 81 mg ASA Continue rosuvastatin, but will likely need increased dose based on most recent LDL of 109.  We can address this in follow-up.  He apparently did not tolerate higher doses. Continue low-dose metoprolol (Lopressor) With diagnosis of diabetes and blood pressure being somewhat labile, may consider ARB in follow-up.       Essential hypertension (Chronic)    His blood pressure is pretty high today 160/86 on the first check in about 150/86 on my recheck.  He said his home blood pressure was 127/78 this morning.  He has had an issue with whitecoat hypertension in the past.  I recommend that he increase his dose of metoprolol to 50 mg on the evening before and morning of surgery.  This will also provide additional cardiovascular benefit.  He is on ARB-calcium channel blocker pretty much at max dose.  If he needs more blood pressure control, he is currently not on a diuretic which would probably be the next best addition.  I have asked that he take time to keep a blood pressure log for [place] to review when he returns for follow-up.       Relevant Orders   EKG 12-Lead (Completed)   Hyperlipidemia associated with type 2 diabetes mellitus (HCC) (Chronic)  Apparently has diabetes has been diet controlled.  I do not see a recent A1c.  His LDL was 109 on last check from April of this year.  We did not address his lipids at this time, but will need to in the future.  I suspect that we will try to titrate up his rosuvastatin, but he may require additional therapy.  He has been stable for several years, and was not interested in adjusting medications today.  Will continue to follow his labs from PCP, and we will discuss adjusting medications at follow-up visit.       Non-ST  elevation myocardial infarction (NSTEMI), initial care episode (HCC) (Chronic)    Relatively small non-STEMI with a tiny posterior lateral branch subtotally occluded.  Minimal damage done.  No evidence of infarct on Myoview done in the following year.  Normal EF at that time both echo and Myoview.  No active angina or heart failure symptoms.  I would not do another ischemic evaluation at this time in the absence of symptoms when he is more active now than he was then.       ===================================  HPI:    Jesse Petersen is a 75 y.o. male with a PMH notable for CAD/NSTEMI (06/2012 - no PCI), HTN,, HLD, DM-2 (diet-controlled) &~CKD3a who is being seen today for PREOPERATIVE EVALUATION for Lipoma Resection Surgery at the request of Ralene Ok, MD.  NSTEMI 06/2012:  Cath - 99% small PLB (too small for PCI), OM2 40-50% ->  Med Rx DAPT x 1 yr & continued on ASA, statin & beta Blocker Echo 06/2012: EF 60-65%, mild LVH. No RWMA Lexiscan Cardiolite ST 05/2013 (to assess DOE & atypical CP): EF 61%, No ischemia or infarction. Fixed small defects in distal inferior & apical walls c/w soft tissue attenuation.   Jesse Petersen was last seen in November 2014 by Dr. Shirlee Latch for evaluation of -Shortness of breath associated with walking fast, or carrying a moderate load.  Also noting some fatigue.  He had also had a couple episodes of chest tightness and fullness.  No PND orthopnea. -> He was evaluated with a Myoview stress test showing no evidence of ischemia.  Despite this, he still noting exertional dyspnea. -> Recommendation was:  Continue to try exercise in order to build up stamina.  Blood pressures seem to be pretty well controlled.Lipids were controlled.  => He was essentially lost to follow-up after this visit.  Recent Hospitalizations: none  Last visit with Dr. Ludwig Clarks (paper note reviewed) 4/11/022 -> Noted a sizable lipoma on the left upper back I had referred for surgical evaluation.  Labs checked for evaluation.  Reviewed  CV studies:    The following studies were reviewed today: (if available, images/films reviewed: From Epic Chart or Care Everywhere) Reviewed Cath films along with Myoview images / report & Echo report -- PSH updated. (See below)   Interval History:   Jesse Petersen presents here today for cardiology evaluation-essentially to reestablish cardiology care because he needs preop evaluation for his upcoming surgery.  He says he has been pretty much stable and asymptomatic from a cardiovascular standpoint since he saw Dr. Shirlee Latch back in 2014.  He has been very conscious of the recommendation to stay active and exercising.  He walks a mile or 2 every day at 6 in the morning and then does calisthenics.  He also goes to the gym routinely.  He has been limited recently because of left knee pain, but says that he still has  his walking.  He has not had any issues at all with chest pain or pressure with rest or exertion.  He may get little short of breath if he carries groceries up steps repeatedly, but not with even the more vigorous of walking that he does.  No heart failure symptoms of PND, orthopnea, or edema.  As far as activity level easily able to achieve at least 9-10 METS without symptoms.  CV Review of Symptoms (Summary) Cardiovascular ROS: no chest pain or dyspnea on exertion negative for - edema, irregular heartbeat, orthopnea, palpitations, paroxysmal nocturnal dyspnea, rapid heart rate, shortness of breath, or Syncope/near syncope or TIA/amaurosis fugax, claudication  REVIEWED OF SYSTEMS   Review of Systems  Constitutional:  Negative for malaise/fatigue and weight loss.  HENT:  Negative for nosebleeds.   Eyes:        Wears corrective lenses  Respiratory:  Negative for cough and shortness of breath.   Cardiovascular:  Negative for leg swelling.  Gastrointestinal:  Negative for abdominal pain, blood in stool (Mild hemorrhoidal bleeding),  constipation and melena.  Genitourinary:  Negative for dysuria and hematuria.  Musculoskeletal:  Positive for joint pain (Left knee pain-presumably gout). Negative for back pain.       "The lump on his back feels funny "  Neurological:  Negative for dizziness and focal weakness.  Psychiatric/Behavioral:  Negative for memory loss. The patient is not nervous/anxious and does not have insomnia.    I have reviewed and (if needed) personally updated the patient's problem list, medications, allergies, past medical and surgical history, social and family history.   PAST MEDICAL HISTORY   Past Medical History:  Diagnosis Date   Arthritis    mild   CAD (coronary artery disease)    a. NSTEMI 12/13:  LHC 07/08/12: mDx 30%, inferior subbranch of the CFX 40-50%, mRCA 30%, very small-caliber subbranch off the distal PL branch 99% (small)-culprit vessel-2 small for PCI. Medical therapy recommended   Chronic kidney disease, stage III (moderate) (HCC) 10/2020   Most recent creatinine 1.63-GFR 48.   Diabetes mellitus without complication (HCC)    type 2 no meds   Gout    Hepatitis C    unclear of treatment status   Hyperlipidemia associated with type 2 diabetes mellitus (HCC)    DM is diet controlled; On   Hypertension    NSTEMI (non-ST elevated myocardial infarction) (HCC) 06/2012   Culprit Lesion ~99% small cabliber PLB - Med Rx.    PAST SURGICAL HISTORY   Past Surgical History:  Procedure Laterality Date   LEFT HEART CATHETERIZATION WITH CORONARY ANGIOGRAM N/A 07/08/2012   Procedure: LEFT HEART CATHETERIZATION WITH CORONARY ANGIOGRAM;  Surgeon: Kathleene Hazel, MD;  Location[facility] CATH LAB;  Service: Cardiovascular;; 99% small PLB (too small for PCI), OM2 40-50%  -NO PCI. MED MNGMT   NM MYOVIEW LTD  05/2013   Lexiscan Cardiolie ST: (to assess DOE & atypical CP): EF 61%, No ischemia or infarction. Fixed small defects in distal inferior & apical walls c/w soft tissue attenuation.    TRANSTHORACIC ECHOCARDIOGRAM  06/2012   Mild LVH, EF 60-65%, Gr 1 diast dysfn, mildly dilated Ao root (38 mm), Tr MR, mild LAE, PASP 23.   TRANSURETHRAL RESECTION OF PROSTATE     Dr. Alvester Morin 10-08-17   TRANSURETHRAL RESECTION OF PROSTATE N/A 10/08/2017   Procedure: TRANSURETHRAL RESECTION OF THE PROSTATE (TURP);  Surgeon: Crista Elliot, MD;  Location: [facility] ORS;  Service: Urology;  Laterality: N/A;  There is no immunization history on file for this patient.   Moderna COVID-19 vaccine x3 ->   MEDICATIONS/ALLERGIES   Current Meds  Medication Sig   allopurinol (ZYLOPRIM) 100 MG tablet Take 100 mg by mouth daily.   aspirin EC 81 MG tablet Take 81 mg by mouth daily.   colchicine 0.6 MG tablet Take 0.6 mg by mouth 2 (two) times daily.   fluticasone (FLONASE) 50 MCG/ACT nasal spray Place 1 spray into both nostrils daily as needed for allergies or rhinitis.   gabapentin (NEURONTIN) 300 MG capsule Take 300 mg by mouth 2 (two) times daily.   Multiple Vitamin (MULTIVITAMIN WITH MINERALS) TABS tablet Take 1 tablet by mouth daily.   rosuvastatin (CRESTOR) 10 MG tablet Take 10 mg by mouth daily.   Telmisartan-amLODIPine 40-10 MG TABS Take 1 tablet by mouth daily.    No Known Allergies  SOCIAL HISTORY/FAMILY HISTORY   Reviewed in Epic:  Pertinent findings:  Social History   Tobacco Use   Smoking status: Never   Smokeless tobacco: Never  Vaping Use   Vaping Use: Never used  Substance Use Topics   Alcohol use: No   Drug use: Yes    Types: Marijuana   Social History   Social History Narrative   Currently is listed as "single", and lives alone here in [place].-> he has a total of 5 children, 1 is deceased.   He is a retired [employer] architecttruck driver.  He exercises regularly-goes for walks at 6:00 on morning 1 to 2 miles every day routinely.  He also does do some calisthenics and sit ups etc. after he finishes walking.   He also enjoys working out at [employer] gym just about every day.  Has been  limited by knee pain both walking and gym.   Former smoker, and distant history of cocaine.     OBJCTIVE -PE, EKG, labs   Wt Readings from Last 3 Encounters:  01/24/21 212 lb 12.8 oz (96.5 kg)  12/30/20 206 lb (93.4 kg)  10/08/17 235 lb (106.6 kg)    Physical Exam: BP (!) 150/86 (BP Location: Left Arm, Patient Position: Sitting, Cuff Size: Normal)   Pulse 88   Ht 6' 2.5" (1.892 m)   Wt 212 lb 12.8 oz (96.5 kg)   SpO2 95%   BMI 26.96 kg/m  Physical Exam Vitals reviewed.  Constitutional:      General: He is not in acute distress.    Appearance: Normal appearance. He is normal weight. He is not ill-appearing or toxic-appearing.  HENT:     Head: Normocephalic and atraumatic.  Neck:     Vascular: No carotid bruit or JVD.  Cardiovascular:     Rate and Rhythm: Normal rate and regular rhythm. No extrasystoles are present.    Chest Wall: PMI is not displaced.     Pulses: Normal pulses.     Heart sounds: Normal heart sounds. No murmur heard.   No friction rub. No gallop.  Pulmonary:     Effort: Pulmonary effort is normal. No respiratory distress.     Breath sounds: Normal breath sounds. No stridor. No wheezing.  Musculoskeletal:        General: No swelling. Normal range of motion.     Cervical back: Normal range of motion and neck supple.     Comments: He does have a palpable baseball sized lipoma on the left side of his back.  Skin:    General: Skin is warm and dry.  Neurological:  General: No focal deficit present.     Mental Status: He is alert and oriented to person, place, and time.     Cranial Nerves: No cranial nerve deficit.     Motor: No weakness.  Psychiatric:        Mood and Affect: Mood normal.        Behavior: Behavior normal.        Thought Content: Thought content normal.        Judgment: Judgment normal.     Adult ECG Report  Rate: 88 ;  Rhythm: ; normal sinus rhythm; cannot exclude inferior MI, age-indeterminate.  Otherwise normal axis, intervals and  durations.  Narrative Interpretation: Essentially normal  EKG from PCP dated 11/08/2020: NSR.  Normal axis, intervals and durations.  Low voltage in limb leads.  Mild artifact in V6, otherwise normal EKG.  Recent Labs:  11/08/2020 Na+ 140, K+ 5.0, Cl- 103, HCO3- 26 , BUN 26, Cr 1.61*(GFR 48), Glu 93, Ca2+ 9.7; AST 24, ALT 29, AlkP 123 CBC: W 7.0, H/H 13.7/40.7, Plt 178 TC 187, TG 99, HDL 58, LDL 109  ==================================================  COVID-19 Education: The signs and symptoms of COVID-19 were discussed with the patient and how to seek care for testing (follow up with PCP or arrange E-visit).    I spent a total of 30 minutes with the patient spent in direct patient consultation.  Additional time spent with chart review  / charting (studies, outside notes, etc): 34 min --> Clinic note from his PCP as well as from Dr. Shirlee Latch were reviewed.  I also been reviewed his echocardiogram, Myoview and cardiac cath reports as well as images.  Labs reviewed, PSH and PMH updated.  Total Time: 56 min  Current medicines are reviewed at length with the patient today.  (+/- concerns) n/a  This visit occurred during the SARS-CoV-2 public health emergency.  Safety protocols were in place, including screening questions prior to the visit, additional usage of staff PPE, and extensive cleaning of exam room while observing appropriate contact time as indicated for disinfecting solutions.  Notice: This dictation was prepared with Dragon dictation along with smaller phrase technology. Any transcriptional errors that result from this process are unintentional and may not be corrected upon review.  Patient Instructions / Medication Changes & Studies & Tests Ordered   Patient Instructions  Medication Instructions:  No changes     Lab Work: Not needed    Testing/Procedures: Not needed   Follow-Up: At [facility], you and your health needs are our priority.  As part of our  continuing mission to provide you with exceptional heart care, we have created designated Provider Care Teams.  These Care Teams include your primary Cardiologist (physician) and Advanced Practice Providers (APPs -  Physician Assistants and Nurse Practitioners) who all work together to provide you with the care you need, when you need it.  We recommend signing up for the patient portal called "MyChart".  Sign up information is provided on this After Visit Summary.  MyChart is used to connect with patients for Virtual Visits (Telemedicine).  Patients are able to view lab/test results, encounter notes, upcoming appointments, etc.  Non-urgent messages can be sent to your provider as well.   To learn more about what you can do with MyChart, go to [email or web address].    Your next appointment:   8 month(s) Feb 2023  The format for your next appointment:   In Person  Provider:   Bryan Lemma, MD   Other  Instructions   You have cardiac clearance for upcoming surgery with Dr Merry Proud.     Studies Ordered:   Orders Placed This Encounter  Procedures   EKG 12-Lead     Bryan Lemma, M.D., M.S. Interventional Cardiologist   Pager # [phone] Phone # [phone] [address] [place], [place] [address]   Thank you for choosing Heartcare at [facility]!!

## 2021-01-24 NOTE — Patient Instructions (Addendum)
Medication Instructions:  No changes     Lab Work: Not needed    Testing/Procedures: Not needed   Follow-Up: At [facility], you and your health needs are our priority.  As part of our continuing mission to provide you with exceptional heart care, we have created designated Provider Care Teams.  These Care Teams include your primary Cardiologist (physician) and Advanced Practice Providers (APPs -  Physician Assistants and Nurse Practitioners) who all work together to provide you with the care you need, when you need it.  We recommend signing up for the patient portal called "MyChart".  Sign up information is provided on this After Visit Summary.  MyChart is used to connect with patients for Virtual Visits (Telemedicine).  Patients are able to view lab/test results, encounter notes, upcoming appointments, etc.  Non-urgent messages can be sent to your provider as well.   To learn more about what you can do with MyChart, go to [email or web address].    Your next appointment:   8 month(s) Feb 2023  The format for your next appointment:   In Person  Provider:   Bryan Lemma, MD   Other Instructions   You have cardiac clearance for upcoming surgery with Dr Merry Proud.

## 2021-01-29 ENCOUNTER — Encounter: Payer: Self-pay | Admitting: Cardiology

## 2021-01-29 DIAGNOSIS — Z01810 Encounter for preprocedural cardiovascular examination: Secondary | ICD-10-CM | POA: Insufficient documentation

## 2021-01-29 DIAGNOSIS — I2510 Atherosclerotic heart disease of native coronary artery without angina pectoris: Secondary | ICD-10-CM | POA: Insufficient documentation

## 2021-01-29 NOTE — Assessment & Plan Note (Signed)
Apparently has diabetes has been diet controlled.  I do not see a recent A1c.  His LDL was 109 on last check from April of this year.  We did not address his lipids at this time, but will need to in the future.  I suspect that we will try to titrate up his rosuvastatin, but he may require additional therapy.  He has been stable for several years, and was not interested in adjusting medications today.  Will continue to follow his labs from PCP, and we will discuss adjusting medications at follow-up visit.

## 2021-01-29 NOTE — Assessment & Plan Note (Signed)
I reviewed his cath films, he had a very small caliber posterolateral branch with 99% stenosis.  I suspect that vessel is probably occluded.  He has not had any further anginal symptoms, had a nonischemic Myoview 1 year later.  EF was normal on echo and Myoview.  Very active with no ongoing symptoms.  Plan:  Continue 81 mg ASA  Continue rosuvastatin, but will likely need increased dose based on most recent LDL of 109.  We can address this in follow-up.  He apparently did not tolerate higher doses.  Continue low-dose metoprolol (Lopressor)  With diagnosis of diabetes and blood pressure being somewhat labile, may consider ARB in follow-up.

## 2021-01-29 NOTE — Assessment & Plan Note (Signed)
His blood pressure is pretty high today 160/86 on the first check in about 150/86 on my recheck.  He said his home blood pressure was 127/78 this morning.  He has had an issue with whitecoat hypertension in the past.  I recommend that he increase his dose of metoprolol to 50 mg on the evening before and morning of surgery.  This will also provide additional cardiovascular benefit.  He is on ARB-calcium channel blocker pretty much at max dose.  If he needs more blood pressure control, he is currently not on a diuretic which would probably be the next best addition.  I have asked that he take time to keep a blood pressure log for [place] to review when he returns for follow-up.

## 2021-01-29 NOTE — Assessment & Plan Note (Signed)
Jesse Petersen is doing remarkably well.  He has not had any cardiac symptoms since his non-STEMI back in 2013.  He had a Myoview done the following year which confirms no ischemia or infarction.  Preserved EF.  He is probably more active with exercising both walking and gym exercises now that he was at that time.   Is easily able to achieve more than a if not close to 10 METS Lipoma surgery is a very low risk surgery, and would not cause any untoward cardiovascular effect.  Based on the rEvised Cardiac Risk Index, he would be considered a Class I Risk-estimated 3.9% 30-day risk of death MI or cardiac arrest based on his medical history.  Ongoing treatment with longstanding beta-blocker reduces this risk by 50%.  In the absence of any active cardiac symptoms, no further testing is warranted.  It would not change management.  He is okay to proceed with surgery with Dr. Katy Apo with no further cardiovascular evaluation.  I did recommend that he increase his dose of metoprolol to 50 mg on the evening before and morning of surgery.Marland Kitchen

## 2021-01-29 NOTE — Assessment & Plan Note (Signed)
Relatively small non-STEMI with a tiny posterior lateral branch subtotally occluded.  Minimal damage done.  No evidence of infarct on Myoview done in the following year.  Normal EF at that time both echo and Myoview.  No active angina or heart failure symptoms.  I would not do another ischemic evaluation at this time in the absence of symptoms when he is more active now than he was then.

## 2021-02-14 ENCOUNTER — Encounter: Payer: Self-pay | Admitting: Surgical

## 2021-02-14 NOTE — Progress Notes (Signed)
Surgical Clearance has been received from Dr. Dr. Bryan Lemma, MD at [facility] for patient's upcoming surgery with Dr. Arita Miss. Medications to hold prior to surgery: Aspirin (Stop taking: 5 days prior to surgery  Patient is low risk from a cardiac standpoint for upcoming procedure per cardiology.

## 2021-02-15 ENCOUNTER — Encounter: Payer: Self-pay | Admitting: Surgical

## 2021-02-15 ENCOUNTER — Other Ambulatory Visit: Payer: Self-pay

## 2021-02-15 ENCOUNTER — Ambulatory Visit (INDEPENDENT_AMBULATORY_CARE_PROVIDER_SITE_OTHER): Payer: Medicare Other | Admitting: Surgical

## 2021-02-15 VITALS — BP 183/94 | HR 84 | Ht 74.5 in | Wt 213.6 lb

## 2021-02-15 DIAGNOSIS — L723 Sebaceous cyst: Secondary | ICD-10-CM

## 2021-02-15 MED ORDER — ONDANSETRON HCL 4 MG PO TABS: 4.0000 mg | ORAL_TABLET | Freq: Three times a day (TID) | ORAL | 0 refills | Status: AC | PRN

## 2021-02-15 MED ORDER — TRAMADOL HCL 50 MG PO TABS: 50.0000 mg | ORAL_TABLET | Freq: Three times a day (TID) | ORAL | 0 refills | Status: AC | PRN

## 2021-02-15 NOTE — Progress Notes (Signed)
Surgical Instructions    Your procedure is scheduled on Monday, August 1st, 2022.   Report to [facility] Main Entrance "A" at 09:30 A.M., then check in with the Admitting office.  Call this number if you have problems the morning of surgery:  [phone]   If you have any questions prior to your surgery date call [phone]: Open Monday-Friday 8am-4pm    Remember:  Do not eat after midnight the night before your surgery  You may drink clear liquids until 08:30 the morning of your surgery.   Clear liquids allowed are: Water, Non-Citrus Juices (without pulp), Carbonated Beverages, Clear Tea, Black Coffee Only, and Gatorade    Take these medicines the morning of surgery with A SIP OF WATER:  allopurinol (ZYLOPRIM) colchicine gabapentin (NEURONTIN) pravastatin (PRAVACHOL)  If needed:  fluticasone (FLONASE) nitroGLYCERIN (NITROSTAT)  Stop taking aspirin EC 5 days prior to surgery per Dr. Bryan Lemma recommendation.  As of today, STOP taking any Aspirin (unless otherwise instructed by your surgeon) Aleve, Naproxen, Ibuprofen, Motrin, Advil, Goody's, BC's, all herbal medications, fish oil, and all vitamins.          Do not wear jewelry  Do not wear lotions, powders, colognes, or deodorant. Men may shave face and neck. Do not bring valuables to the hospital. DO Not wear nail polish, gel polish, artificial nails, or any other type of covering on natural nails including finger and toenails. If patients have artificial nails, gel coating, etc. that need to be removed by a nail salon please have this removed prior to surgery or surgery may need to be canceled/delayed if the surgeon/ anesthesia feels like the patient is unable to be adequately monitored.             [facility] is not responsible for any belongings or valuables.  Do NOT Smoke (Tobacco/Vaping) or drink Alcohol 24 hours prior to your procedure If you use a CPAP at night, you may bring all equipment for your  overnight stay.   Contacts, glasses, dentures or bridgework may not be worn into surgery, please bring cases for these belongings   For patients admitted to the hospital, discharge time will be determined by your treatment team.   Patients discharged the day of surgery will not be allowed to drive home, and someone needs to stay with them for 24 hours.  ONLY 1 SUPPORT PERSON MAY BE PRESENT WHILE YOU ARE IN SURGERY. IF YOU ARE TO BE ADMITTED ONCE YOU ARE IN YOUR ROOM YOU WILL BE ALLOWED TWO (2) VISITORS.  Minor children may have two parents present. Special consideration for safety and communication needs will be reviewed on a case by case basis.  Special instructions:    Oral Hygiene is also important to reduce your risk of infection.  Remember - BRUSH YOUR TEETH THE MORNING OF SURGERY WITH YOUR REGULAR TOOTHPASTE   [facility]- Preparing For Surgery  Before surgery, you can play an important role. Because skin is not sterile, your skin needs to be as free of germs as possible. You can reduce the number of germs on your skin by washing with CHG (chlorahexidine gluconate) Soap before surgery.  CHG is an antiseptic cleaner which kills germs and bonds with the skin to continue killing germs even after washing.     Please do not use if you have an allergy to CHG or antibacterial soaps. If your skin becomes reddened/irritated stop using the CHG.  Do not shave (including legs and underarms) for at least  48 hours prior to first CHG shower. It is OK to shave your face.  Please follow these instructions carefully.     Shower the NIGHT BEFORE SURGERY and the MORNING OF SURGERY with CHG Soap.   If you chose to wash your hair, wash your hair first as usual with your normal shampoo. After you shampoo, rinse your hair and body thoroughly to remove the shampoo.  Then [employer] and genitals (private parts) with your normal soap and rinse thoroughly to remove soap.  After that Use CHG Soap as you would  any other liquid soap. You can apply CHG directly to the skin and wash gently with a scrungie or a clean washcloth.   Apply the CHG Soap to your body ONLY FROM THE NECK DOWN.  Do not use on open wounds or open sores. Avoid contact with your eyes, ears, mouth and genitals (private parts). Wash Face and genitals (private parts)  with your normal soap.   Wash thoroughly, paying special attention to the area where your surgery will be performed.  Thoroughly rinse your body with warm water from the neck down.  DO NOT shower/wash with your normal soap after using and rinsing off the CHG Soap.  Pat yourself dry with a CLEAN TOWEL.  Wear CLEAN PAJAMAS to bed the night before surgery  Place CLEAN SHEETS on your bed the night before your surgery  DO NOT SLEEP WITH PETS.   Day of Surgery:  Take a shower with CHG soap. Wear Clean/Comfortable clothing the morning of surgery Do not apply any deodorants/lotions.   Remember to brush your teeth WITH YOUR REGULAR TOOTHPASTE.   Please read over the following fact sheets that you were given.

## 2021-02-15 NOTE — Progress Notes (Signed)
Patient ID: Jesse Petersen, male    DOB: April 07, 1946, 75 y.o.   MRN: [id number]  Chief Complaint  Patient presents with   Pre-op Exam      ICD-10-CM   1. Sebaceous cyst  L72.3       History of Present Illness: Jesse Petersen is a 75 y.o.  male  with a history of a lower back mass and central forehead mass.  He presents for preoperative evaluation for upcoming procedure, excision of left low back mass and excision of central forehead mass, scheduled for 02/28/2021 with Dr. Arita Miss.  Patient reports anesthesia as a young child, no known anesthesia as an adult.  He does not recall any history of complications.  No known family history. No history of DVT/PE.  No family history of DVT/PE.  No family or personal history of bleeding or clotting disorders.    PMH Significant for: Arthritis, coronary artery disease, NSTEMI chronic kidney disease, diabetes mellitus, gout, hepatitis C, hyperlipidemia, hypertension.  Patient is scheduled for preadmission testing tomorrow. Patient has been advised to stop aspirin 5 days prior to surgery. Per cardiology patient is low risk from a cardiac standpoint for upcoming surgery.  Patient reports no recent changes in his health.  He reports that he is feeling well.  Past Medical History: Allergies: No Known Allergies  Current Medications:  Current Outpatient Medications:    allopurinol (ZYLOPRIM) 100 MG tablet, Take 100 mg by mouth daily., Disp: , Rfl:    aspirin EC 81 MG tablet, Take 81 mg by mouth daily., Disp: , Rfl:    b complex vitamins capsule, Take 1 capsule by mouth daily., Disp: , Rfl:    Cholecalciferol (VITAMIN D) 50 MCG (2000 UT) tablet, Take 2,000 Units by mouth daily., Disp: , Rfl:    colchicine 0.6 MG tablet, Take 0.6 mg by mouth 2 (two) times daily., Disp: , Rfl:    fluticasone (FLONASE) 50 MCG/ACT nasal spray, Place 1 spray into both nostrils daily as needed for allergies or rhinitis., Disp: , Rfl:    gabapentin (NEURONTIN) 300 MG capsule,  Take 600 mg by mouth 2 (two) times daily., Disp: , Rfl:    Multiple Vitamin (MULTIVITAMIN WITH MINERALS) TABS tablet, Take 1 tablet by mouth daily., Disp: , Rfl:    nitroGLYCERIN (NITROSTAT) 0.4 MG SL tablet, Place 1 tablet (0.4 mg total) under the tongue every 5 (five) minutes as needed for chest pain., Disp: 25 tablet, Rfl: 3   [START ON 02/24/2021] ondansetron (ZOFRAN) 4 MG tablet, Take 1 tablet (4 mg total) by mouth every 8 (eight) hours as needed for nausea or vomiting., Disp: 20 tablet, Rfl: 0   pravastatin (PRAVACHOL) 20 MG tablet, Take 20 mg by mouth daily., Disp: , Rfl:    Telmisartan-amLODIPine 40-10 MG TABS, Take 1 tablet by mouth daily., Disp: , Rfl:    [START ON 02/25/2021] traMADol (ULTRAM) 50 MG tablet, Take 1 tablet (50 mg total) by mouth every 8 (eight) hours as needed for up to 5 days., Disp: 15 tablet, Rfl: 0  Past Medical Problems: Past Medical History:  Diagnosis Date   Arthritis    mild   CAD (coronary artery disease)    a. NSTEMI 12/13:  LHC 07/08/12: mDx 30%, inferior subbranch of the CFX 40-50%, mRCA 30%, very small-caliber subbranch off the distal PL branch 99% (small)-culprit vessel-2 small for PCI. Medical therapy recommended   Chronic kidney disease, stage III (moderate) (HCC) 10/2020   Most recent creatinine 1.63-GFR 48.  Diabetes mellitus without complication (HCC)    type 2 no meds   Gout    Hepatitis C    unclear of treatment status   Hyperlipidemia associated with type 2 diabetes mellitus (HCC)    DM is diet controlled; On   Hypertension    NSTEMI (non-ST elevated myocardial infarction) (HCC) 06/2012   Culprit Lesion ~99% small cabliber PLB - Med Rx.    Past Surgical History: Past Surgical History:  Procedure Laterality Date   LEFT HEART CATHETERIZATION WITH CORONARY ANGIOGRAM N/A 07/08/2012   Procedure: LEFT HEART CATHETERIZATION WITH CORONARY ANGIOGRAM;  Surgeon: Kathleene Hazel, MD;  Location: [facility] CATH LAB;  Service: Cardiovascular;; 99% small  PLB (too small for PCI), OM2 40-50%  -NO PCI. MED MNGMT   NM MYOVIEW LTD  05/2013   Lexiscan Cardiolie ST: (to assess DOE & atypical CP): EF 61%, No ischemia or infarction. Fixed small defects in distal inferior & apical walls c/w soft tissue attenuation.   TRANSTHORACIC ECHOCARDIOGRAM  06/2012   Mild LVH, EF 60-65%, Gr 1 diast dysfn, mildly dilated Ao root (38 mm), Tr MR, mild LAE, PASP 23.   TRANSURETHRAL RESECTION OF PROSTATE     Dr. Alvester Morin 10-08-17   TRANSURETHRAL RESECTION OF PROSTATE N/A 10/08/2017   Procedure: TRANSURETHRAL RESECTION OF THE PROSTATE (TURP);  Surgeon: Crista Elliot, MD;  Location: [facility] ORS;  Service: Urology;  Laterality: N/A;    Social History: Social History   Socioeconomic History   Marital status: Single    Spouse name: Not on file   Number of children: 5   Years of education: Not on file   Highest education level: Not on file  Occupational History   Occupation: retired   Occupation: Naval architect    Comment: Retired  Tobacco Use   Smoking status: Never   Smokeless tobacco: Never  Building services engineer Use: Never used  Substance and Sexual Activity   Alcohol use: No   Drug use: Yes    Types: Marijuana   Sexual activity: Yes    Partners: Female  Other Topics Concern   Not on file  Social History Narrative   Currently is listed as "single", and lives alone here in [place].-> he has a total of 5 children, 1 is deceased.   He is a retired Naval architect.  He exercises regularly-goes for walks at 6:00 on morning 1 to 2 miles every day routinely.  He also does do some calisthenics and sit ups etc. after he finishes walking.   He also enjoys working out at [employer] just about every day.  Has been limited by knee pain both walking and gym.   Former smoker, and distant history of cocaine.   Social Determinants of Health   Financial Resource Strain: Not on file  Food Insecurity: Not on file  Transportation Needs: Not on file  Physical Activity: Not on  file  Stress: Not on file  Social Connections: Not on file  Intimate Partner Violence: Not on file    Family History: Family History  Problem Relation Age of Onset   Hypertension Mother    Heart murmur Mother    Hypertension Father     Review of Systems: Review of Systems  Constitutional: Negative.   Respiratory: Negative.    Cardiovascular: Negative.   Gastrointestinal: Negative.   Neurological: Negative.    Physical Exam: Vital Signs BP (!) 183/94 (BP Location: Left Arm, Patient Position: Sitting, Cuff Size: Large)   Pulse 84  Ht 6' 2.5" (1.892 m)   Wt 213 lb 9.6 oz (96.9 kg)   SpO2 97%   BMI 27.06 kg/m   Physical Exam  Constitutional:      General: Not in acute distress.    Appearance: Normal appearance. Not ill-appearing.  HENT:     Head: Normocephalic and atraumatic.  Eyes:     Pupils: Pupils are equal, round Neck:     Musculoskeletal: Normal range of motion.  Cardiovascular:     Rate and Rhythm: Normal rate    Pulses: Normal pulses.  Pulmonary:     Effort: Pulmonary effort is normal. No respiratory distress.  Abdominal:     General: Abdomen is flat. There is no distension.  Musculoskeletal: Normal range of motion.  Skin:    General: Skin is warm and dry.     Findings: No erythema or rash.  Neurological:     General: No focal deficit present.     Mental Status: Alert and oriented to person, place, and time. Mental status is at baseline.     Motor: No weakness.  Psychiatric:        Mood and Affect: Mood normal.        Behavior: Behavior normal.    Assessment/Plan: The patient is scheduled for excision of left lower back mass and excision of central forehead mass with Dr. Arita Miss.  Risks, benefits, and alternatives of procedure discussed, questions answered and consent obtained.    Smoking Status: Non-smoker; Counseling Given?  N/A  Caprini Score: 5, high; Risk Factors include: Age, BMI > 25, and length of planned surgery. Recommendation for  mechanical and pharmacological prophylaxis. Encourage early ambulation.   Pictures were obtained of the patient and placed in the chart with the patient's or guardian's permission.  Post-op Rx sent to pharmacy: Tramadol, Zofran  Patient was provided with the General Surgical Risk consent document and Pain Medication Agreement prior to their appointment.  They had adequate time to read through the risk consent documents and Pain Medication Agreement. We also discussed them in person together during this preop appointment. All of their questions were answered to their satisfaction.  Recommended calling if they have any further questions.  Risk consent form and Pain Medication Agreement to be scanned into patient's chart.   Electronically signed by: Kermit Balo Citlaly Camplin, PA-C 02/15/2021 3:27 PM

## 2021-02-15 NOTE — H&P (View-Only) (Signed)
Patient ID: Jesse Petersen, male    DOB: April 07, 1946, 75 y.o.   MRN: [id number]  Chief Complaint  Patient presents with   Pre-op Exam      ICD-10-CM   1. Sebaceous cyst  L72.3       History of Present Illness: Jesse Petersen is a 75 y.o.  male  with a history of a lower back mass and central forehead mass.  He presents for preoperative evaluation for upcoming procedure, excision of left low back mass and excision of central forehead mass, scheduled for 02/28/2021 with Dr. Arita Miss.  Patient reports anesthesia as a young child, no known anesthesia as an adult.  He does not recall any history of complications.  No known family history. No history of DVT/PE.  No family history of DVT/PE.  No family or personal history of bleeding or clotting disorders.    PMH Significant for: Arthritis, coronary artery disease, NSTEMI chronic kidney disease, diabetes mellitus, gout, hepatitis C, hyperlipidemia, hypertension.  Patient is scheduled for preadmission testing tomorrow. Patient has been advised to stop aspirin 5 days prior to surgery. Per cardiology patient is low risk from a cardiac standpoint for upcoming surgery.  Patient reports no recent changes in his health.  He reports that he is feeling well.  Past Medical History: Allergies: No Known Allergies  Current Medications:  Current Outpatient Medications:    allopurinol (ZYLOPRIM) 100 MG tablet, Take 100 mg by mouth daily., Disp: , Rfl:    aspirin EC 81 MG tablet, Take 81 mg by mouth daily., Disp: , Rfl:    b complex vitamins capsule, Take 1 capsule by mouth daily., Disp: , Rfl:    Cholecalciferol (VITAMIN D) 50 MCG (2000 UT) tablet, Take 2,000 Units by mouth daily., Disp: , Rfl:    colchicine 0.6 MG tablet, Take 0.6 mg by mouth 2 (two) times daily., Disp: , Rfl:    fluticasone (FLONASE) 50 MCG/ACT nasal spray, Place 1 spray into both nostrils daily as needed for allergies or rhinitis., Disp: , Rfl:    gabapentin (NEURONTIN) 300 MG capsule,  Take 600 mg by mouth 2 (two) times daily., Disp: , Rfl:    Multiple Vitamin (MULTIVITAMIN WITH MINERALS) TABS tablet, Take 1 tablet by mouth daily., Disp: , Rfl:    nitroGLYCERIN (NITROSTAT) 0.4 MG SL tablet, Place 1 tablet (0.4 mg total) under the tongue every 5 (five) minutes as needed for chest pain., Disp: 25 tablet, Rfl: 3   [START ON 02/24/2021] ondansetron (ZOFRAN) 4 MG tablet, Take 1 tablet (4 mg total) by mouth every 8 (eight) hours as needed for nausea or vomiting., Disp: 20 tablet, Rfl: 0   pravastatin (PRAVACHOL) 20 MG tablet, Take 20 mg by mouth daily., Disp: , Rfl:    Telmisartan-amLODIPine 40-10 MG TABS, Take 1 tablet by mouth daily., Disp: , Rfl:    [START ON 02/25/2021] traMADol (ULTRAM) 50 MG tablet, Take 1 tablet (50 mg total) by mouth every 8 (eight) hours as needed for up to 5 days., Disp: 15 tablet, Rfl: 0  Past Medical Problems: Past Medical History:  Diagnosis Date   Arthritis    mild   CAD (coronary artery disease)    a. NSTEMI 12/13:  LHC 07/08/12: mDx 30%, inferior subbranch of the CFX 40-50%, mRCA 30%, very small-caliber subbranch off the distal PL branch 99% (small)-culprit vessel-2 small for PCI. Medical therapy recommended   Chronic kidney disease, stage III (moderate) (HCC) 10/2020   Most recent creatinine 1.63-GFR 48.  Diabetes mellitus without complication (HCC)    type 2 no meds   Gout    Hepatitis C    unclear of treatment status   Hyperlipidemia associated with type 2 diabetes mellitus (HCC)    DM is diet controlled; On   Hypertension    NSTEMI (non-ST elevated myocardial infarction) (HCC) 06/2012   Culprit Lesion ~99% small cabliber PLB - Med Rx.    Past Surgical History: Past Surgical History:  Procedure Laterality Date   LEFT HEART CATHETERIZATION WITH CORONARY ANGIOGRAM N/A 07/08/2012   Procedure: LEFT HEART CATHETERIZATION WITH CORONARY ANGIOGRAM;  Surgeon: Kathleene Hazel, MD;  Location: [facility] CATH LAB;  Service: Cardiovascular;; 99% small  PLB (too small for PCI), OM2 40-50%  -NO PCI. MED MNGMT   NM MYOVIEW LTD  05/2013   Lexiscan Cardiolie ST: (to assess DOE & atypical CP): EF 61%, No ischemia or infarction. Fixed small defects in distal inferior & apical walls c/w soft tissue attenuation.   TRANSTHORACIC ECHOCARDIOGRAM  06/2012   Mild LVH, EF 60-65%, Gr 1 diast dysfn, mildly dilated Ao root (38 mm), Tr MR, mild LAE, PASP 23.   TRANSURETHRAL RESECTION OF PROSTATE     Dr. Alvester Morin 10-08-17   TRANSURETHRAL RESECTION OF PROSTATE N/A 10/08/2017   Procedure: TRANSURETHRAL RESECTION OF THE PROSTATE (TURP);  Surgeon: Crista Elliot, MD;  Location: [facility] ORS;  Service: Urology;  Laterality: N/A;    Social History: Social History   Socioeconomic History   Marital status: Single    Spouse name: Not on file   Number of children: 5   Years of education: Not on file   Highest education level: Not on file  Occupational History   Occupation: retired   Occupation: Naval architect    Comment: Retired  Tobacco Use   Smoking status: Never   Smokeless tobacco: Never  Building services engineer Use: Never used  Substance and Sexual Activity   Alcohol use: No   Drug use: Yes    Types: Marijuana   Sexual activity: Yes    Partners: Female  Other Topics Concern   Not on file  Social History Narrative   Currently is listed as "single", and lives alone here in [place].-> he has a total of 5 children, 1 is deceased.   He is a retired Naval architect.  He exercises regularly-goes for walks at 6:00 on morning 1 to 2 miles every day routinely.  He also does do some calisthenics and sit ups etc. after he finishes walking.   He also enjoys working out at [employer] just about every day.  Has been limited by knee pain both walking and gym.   Former smoker, and distant history of cocaine.   Social Determinants of Health   Financial Resource Strain: Not on file  Food Insecurity: Not on file  Transportation Needs: Not on file  Physical Activity: Not on  file  Stress: Not on file  Social Connections: Not on file  Intimate Partner Violence: Not on file    Family History: Family History  Problem Relation Age of Onset   Hypertension Mother    Heart murmur Mother    Hypertension Father     Review of Systems: Review of Systems  Constitutional: Negative.   Respiratory: Negative.    Cardiovascular: Negative.   Gastrointestinal: Negative.   Neurological: Negative.    Physical Exam: Vital Signs BP (!) 183/94 (BP Location: Left Arm, Patient Position: Sitting, Cuff Size: Large)   Pulse 84  Ht 6' 2.5" (1.892 m)   Wt 213 lb 9.6 oz (96.9 kg)   SpO2 97%   BMI 27.06 kg/m   Physical Exam  Constitutional:      General: Not in acute distress.    Appearance: Normal appearance. Not ill-appearing.  HENT:     Head: Normocephalic and atraumatic.  Eyes:     Pupils: Pupils are equal, round Neck:     Musculoskeletal: Normal range of motion.  Cardiovascular:     Rate and Rhythm: Normal rate    Pulses: Normal pulses.  Pulmonary:     Effort: Pulmonary effort is normal. No respiratory distress.  Abdominal:     General: Abdomen is flat. There is no distension.  Musculoskeletal: Normal range of motion.  Skin:    General: Skin is warm and dry.     Findings: No erythema or rash.  Neurological:     General: No focal deficit present.     Mental Status: Alert and oriented to person, place, and time. Mental status is at baseline.     Motor: No weakness.  Psychiatric:        Mood and Affect: Mood normal.        Behavior: Behavior normal.    Assessment/Plan: The patient is scheduled for excision of left lower back mass and excision of central forehead mass with Dr. Arita Miss.  Risks, benefits, and alternatives of procedure discussed, questions answered and consent obtained.    Smoking Status: Non-smoker; Counseling Given?  N/A  Caprini Score: 5, high; Risk Factors include: Age, BMI > 25, and length of planned surgery. Recommendation for  mechanical and pharmacological prophylaxis. Encourage early ambulation.   Pictures were obtained of the patient and placed in the chart with the patient's or guardian's permission.  Post-op Rx sent to pharmacy: Tramadol, Zofran  Patient was provided with the General Surgical Risk consent document and Pain Medication Agreement prior to their appointment.  They had adequate time to read through the risk consent documents and Pain Medication Agreement. We also discussed them in person together during this preop appointment. All of their questions were answered to their satisfaction.  Recommended calling if they have any further questions.  Risk consent form and Pain Medication Agreement to be scanned into patient's chart.   Electronically signed by: Kermit Balo Deannie Resetar, PA-C 02/15/2021 3:27 PM

## 2021-02-16 ENCOUNTER — Other Ambulatory Visit: Payer: Self-pay

## 2021-02-16 ENCOUNTER — Encounter (HOSPITAL_COMMUNITY): Payer: Self-pay

## 2021-02-16 ENCOUNTER — Encounter (HOSPITAL_COMMUNITY)
Admission: RE | Admit: 2021-02-16 | Discharge: 2021-02-16 | Disposition: A | Discharge status: Home / Self Care | Payer: Medicare Other | Source: Ambulatory Visit | Attending: Plastic Surgery | Admitting: Plastic Surgery

## 2021-02-16 DIAGNOSIS — Z01812 Encounter for preprocedural laboratory examination: Secondary | ICD-10-CM | POA: Insufficient documentation

## 2021-02-16 LAB — COMPREHENSIVE METABOLIC PANEL
ALT: 22 U/L (ref 0–44)
AST: 24 U/L (ref 15–41)
Albumin: 4.2 g/dL (ref 3.5–5.0)
Alkaline Phosphatase: 102 U/L (ref 38–126)
Anion gap: 9 (ref 5–15)
BUN: 19 mg/dL (ref 8–23)
CO2: 26 mmol/L (ref 22–32)
Calcium: 9.7 mg/dL (ref 8.9–10.3)
Chloride: 103 mmol/L (ref 98–111)
Creatinine, Ser: 1.61 mg/dL — ABNORMAL HIGH (ref 0.61–1.24)
GFR, Estimated: 44 mL/min — ABNORMAL LOW (ref >60)
Glucose, Bld: 94 mg/dL (ref 70–99)
Potassium: 4.2 mmol/L (ref 3.5–5.1)
Sodium: 138 mmol/L (ref 135–145)
Total Bilirubin: 0.5 mg/dL (ref 0.3–1.2)
Total Protein: 6.8 g/dL (ref 6.5–8.1)

## 2021-02-16 LAB — CBC
HCT: 43.3 % (ref 39.0–52.0)
Hemoglobin: 14.3 g/dL (ref 13.0–17.0)
MCH: 32.5 pg (ref 26.0–34.0)
MCHC: 33 g/dL (ref 30.0–36.0)
MCV: 98.4 fL (ref 80.0–100.0)
Platelets: 173 10*3/uL (ref 150–400)
RBC: 4.4 MIL/uL (ref 4.22–5.81)
RDW: 12.8 % (ref 11.5–15.5)
WBC: 5.5 10*3/uL (ref 4.0–10.5)
nRBC: 0 % (ref 0.0–0.2)

## 2021-02-16 LAB — GLUCOSE, CAPILLARY: Glucose-Capillary: 83 mg/dL (ref 70–99)

## 2021-02-16 LAB — HEMOGLOBIN A1C
Hgb A1c MFr Bld: 5.7 % — ABNORMAL HIGH (ref 4.8–5.6)
Mean Plasma Glucose: 116.89 mg/dL

## 2021-02-16 NOTE — Progress Notes (Addendum)
Surgeon's office called in regards to surgical posting not matching orders for consent. Office Chief Operating Officer aware. Per office, Surgeon will update consent order.

## 2021-02-16 NOTE — Progress Notes (Addendum)
PCP - Ralene Ok, MD Cardiologist - Bryan Lemma, MD  PPM/ICD - n/a  Chest x-ray - n/a EKG - 01/24/21 Stress Test - 06/24/13 ECHO - 07/07/12 Cardiac Cath - 07/08/12  Sleep Study - n/a  Fasting Blood Sugar - pt reports it's typically 120s Checks Blood Sugar once a week, diet controlled  Pt states he gets "white coat syndrome at [employer]. Pt reports home BP is normally around "127/70, my heart rate is around 78."   Aspirin Instructions: Per note from MD Herbie Baltimore, stop asa 5 days prior to surgery  ERAS Protcol - no drink  COVID TEST- n/a; ambulatory  *Surgeon's office contacted about updating consent form to include excision of forehead mass, per office, surgeon will update. Consent will need to be signed DOS.  Anesthesia review: yes, hx of CAD  Patient denies shortness of breath, fever, cough and chest pain at PAT appointment   All instructions explained to the patient, with a verbal understanding of the material. Patient agrees to go over the instructions while at home for a better understanding. Patient also instructed to self quarantine after being tested for COVID-19. The opportunity to ask questions was provided.

## 2021-02-17 NOTE — Anesthesia Preprocedure Evaluation (Addendum)
Anesthesia Evaluation  Patient identified by MRN, date of birth, ID band Patient awake    Reviewed: Allergy & Precautions, NPO status , Patient's Chart, lab work & pertinent test results  Airway Mallampati: II  TM Distance: >3 FB Neck ROM: Full    Dental  (+) Partial Upper, Poor Dentition   Pulmonary neg pulmonary ROS,    Pulmonary exam normal breath sounds clear to auscultation       Cardiovascular hypertension, + Past MI  Normal cardiovascular exam Rhythm:Regular Rate:Normal     Neuro/Psych negative neurological ROS  negative psych ROS   GI/Hepatic negative GI ROS, (+) Hepatitis -, C  Endo/Other  diabetes, Type 2  Renal/GU Renal Insufficiency  negative genitourinary   Musculoskeletal  (+) Arthritis ,   Abdominal   Peds  Hematology   Anesthesia Other Findings   Reproductive/Obstetrics                           Anesthesia Physical Anesthesia Plan  ASA: 3  Anesthesia Plan: General   Post-op Pain Management:    Induction: Intravenous  PONV Risk Score and Plan: 2  Airway Management Planned: Oral ETT  Additional Equipment:   Intra-op Plan:   Post-operative Plan: Extubation in OR  Informed Consent: I have reviewed the patients History and Physical, chart, labs and discussed the procedure including the risks, benefits and alternatives for the proposed anesthesia with the patient or authorized representative who has indicated his/her understanding and acceptance.     Dental advisory given  Plan Discussed with: CRNA and Anesthesiologist  Anesthesia Plan Comments: (PAT note by Antionette Poles, PA-C: Follows with cardiology for history of HTN, HLD, CAD s/p NSTEMI 2013.  He had a nuclear stress test 2014 that was low risk.  He was last seen by Dr. Herbie Baltimore on 01/29/2021 and preop clearance was discussed.  Per note, "Sneijder is doing remarkably well. He has not had any cardiac symptoms since  his non-STEMI back in 2013. He had a Myoview done the following year which confirms no ischemia or infarction. Preserved EF. He is probably more active with exercising both walking and gym exercises now that he was at that time. Is easily able to achieve more than a if not close to . Lipoma surgery is a very low risk surgery, and would not cause any untoward cardiovascular effect. Based on the rEvised Cardiac Risk Index, he would be considered a Class I Risk-estimated 3.9% 30-day risk of death MI or cardiac arrest based on his medical history. Ongoing treatment with longstanding beta-blocker reduces this risk by 50%. In the absence of any active cardiac symptoms, no further testing is warranted. It would not change management. He is okay to proceed with surgery with Dr. Alroy Bailiff with no further cardiovascular evaluation. I did recommend that he increase his dose of metoprolol to 50 mg on the evening before and morning of surgery."  Preop labs reviewed, creatinine elevated 1.61 consistent with history of renal insufficiency and appears to be near his baseline.  Diet-controlled DM2 well-controlled, A1c 5.7.  Labs otherwise unremarkable.  EKG 01/24/2021: Normal sinus rhythm.  Rate 88.  Inferior infarct, age undetermined.  Nuclear stress 06/23/2013: Overall Impression: Probable normal perfusion and minimal soft tissue attenuation (diaphragm) Low risk scan.   LV Ejection Fraction: 61%. LV Wall Motion: NL LV Function; NL Wall Motion  Cath 07/08/2012: Impression: 1. Coronary artery disease with severe stenosis in very small caliber sub-branch of right posterolateral branch (too  small for PCI) 2. Moderate stenosis OM2 and mid RCA  Recommendations: Medical management. Would continue dual anti-platelet therapy with ASA and Plavix given his presentation as a NSTEMI with severe stenosis in very small caliber vessel which is not amenable to PCI. Continue statin/beta blocker. Consider long  acting nitrates. )      Anesthesia Quick Evaluation

## 2021-02-17 NOTE — Progress Notes (Signed)
Anesthesia Chart Review:  Follows with cardiology for history of HTN, HLD, CAD s/p NSTEMI 2013.  He had a nuclear stress test 2014 that was low risk.  He was last seen by Dr. Herbie Baltimore on 01/29/2021 and preop clearance was discussed.  Per note, "Aran is doing remarkably well.  He has not had any cardiac symptoms since his non-STEMI back in 2013.  He had a Myoview done the following year which confirms no ischemia or infarction.  Preserved EF.  He is probably more active with exercising both walking and gym exercises now that he was at that time. Is easily able to achieve more than a if not close to 10 METS. Lipoma surgery is a very low risk surgery, and would not cause any untoward cardiovascular effect. Based on the rEvised Cardiac Risk Index, he would be considered a Class I Risk-estimated 3.9% 30-day risk of death MI or cardiac arrest based on his medical history.  Ongoing treatment with longstanding beta-blocker reduces this risk by 50%. In the absence of any active cardiac symptoms, no further testing is warranted.  It would not change management. He is okay to proceed with surgery with Dr. Katy Apo with no further cardiovascular evaluation.  I did recommend that he increase his dose of metoprolol to 50 mg on the evening before and morning of surgery."  Preop labs reviewed, creatinine elevated 1.61 consistent with history of renal insufficiency and appears to be near his baseline.  Diet-controlled DM2 well-controlled, A1c 5.7.  Labs otherwise unremarkable.  EKG 01/24/2021: Normal sinus rhythm.  Rate 88.  Inferior infarct, age undetermined.  Nuclear stress 06/23/2013: Overall Impression:  Probable normal perfusion and minimal soft tissue attenuation (diaphragm)  Low risk scan.     LV Ejection Fraction: 61%.  LV Wall Motion:  NL LV Function; NL Wall Motion  Cath 07/08/2012: Impression: 1. Coronary artery disease with severe stenosis in very small caliber sub-branch of right posterolateral branch  (too small for PCI) 2. Moderate stenosis OM2 and mid RCA   Recommendations: Medical management. Would continue dual anti-platelet therapy with ASA and Plavix given his presentation as a NSTEMI with severe stenosis in very small caliber vessel which is not amenable to PCI. Continue statin/beta blocker. Consider long acting nitrates.         [place], [place] [facility] Stay [facility]/Anesthesiology Phone [phone] 02/17/2021 4:03 PM

## 2021-02-28 ENCOUNTER — Other Ambulatory Visit: Payer: Self-pay

## 2021-02-28 ENCOUNTER — Encounter (HOSPITAL_COMMUNITY): Payer: Self-pay | Admitting: Plastic Surgery

## 2021-02-28 ENCOUNTER — Ambulatory Visit (HOSPITAL_COMMUNITY): Payer: Medicare Other | Admitting: Physician Assistant

## 2021-02-28 ENCOUNTER — Encounter (HOSPITAL_COMMUNITY)
Admission: RE | Disposition: A | Discharge status: Home / Self Care | Payer: Self-pay | Source: Home / Self Care | Attending: Plastic Surgery

## 2021-02-28 ENCOUNTER — Ambulatory Visit (HOSPITAL_COMMUNITY)
Admission: RE | Admit: 2021-02-28 | Discharge: 2021-02-28 | Disposition: A | Discharge status: Home / Self Care | Payer: Medicare Other | Attending: Plastic Surgery | Admitting: Plastic Surgery

## 2021-02-28 ENCOUNTER — Ambulatory Visit (HOSPITAL_COMMUNITY): Payer: Medicare Other | Admitting: Anesthesiology

## 2021-02-28 DIAGNOSIS — N183 Chronic kidney disease, stage 3 unspecified: Secondary | ICD-10-CM | POA: Diagnosis not present

## 2021-02-28 DIAGNOSIS — Z87891 Personal history of nicotine dependence: Secondary | ICD-10-CM | POA: Insufficient documentation

## 2021-02-28 DIAGNOSIS — E785 Hyperlipidemia, unspecified: Secondary | ICD-10-CM | POA: Diagnosis not present

## 2021-02-28 DIAGNOSIS — E1122 Type 2 diabetes mellitus with diabetic chronic kidney disease: Secondary | ICD-10-CM | POA: Diagnosis not present

## 2021-02-28 DIAGNOSIS — E1169 Type 2 diabetes mellitus with other specified complication: Secondary | ICD-10-CM | POA: Insufficient documentation

## 2021-02-28 DIAGNOSIS — M109 Gout, unspecified: Secondary | ICD-10-CM | POA: Insufficient documentation

## 2021-02-28 DIAGNOSIS — Z79899 Other long term (current) drug therapy: Secondary | ICD-10-CM | POA: Insufficient documentation

## 2021-02-28 DIAGNOSIS — Z8619 Personal history of other infectious and parasitic diseases: Secondary | ICD-10-CM | POA: Diagnosis not present

## 2021-02-28 DIAGNOSIS — I252 Old myocardial infarction: Secondary | ICD-10-CM | POA: Insufficient documentation

## 2021-02-28 DIAGNOSIS — Z7982 Long term (current) use of aspirin: Secondary | ICD-10-CM | POA: Diagnosis not present

## 2021-02-28 DIAGNOSIS — I129 Hypertensive chronic kidney disease with stage 1 through stage 4 chronic kidney disease, or unspecified chronic kidney disease: Secondary | ICD-10-CM | POA: Insufficient documentation

## 2021-02-28 DIAGNOSIS — I2510 Atherosclerotic heart disease of native coronary artery without angina pectoris: Secondary | ICD-10-CM | POA: Insufficient documentation

## 2021-02-28 DIAGNOSIS — L720 Epidermal cyst: Secondary | ICD-10-CM

## 2021-02-28 HISTORY — PX: MASS EXCISION: SHX2000

## 2021-02-28 HISTORY — PX: EXCISION MASS HEAD: SHX6702

## 2021-02-28 LAB — GLUCOSE, CAPILLARY
Glucose-Capillary: 118 mg/dL — ABNORMAL HIGH (ref 70–99)
Glucose-Capillary: 87 mg/dL (ref 70–99)
Glucose-Capillary: 87 mg/dL (ref 70–99)

## 2021-02-28 SURGERY — EXCISION MASS
Anesthesia: General | Site: Face

## 2021-02-28 MED ORDER — FENTANYL CITRATE (PF) 250 MCG/5ML IJ SOLN
INTRAMUSCULAR | Status: AC
  Filled 2021-02-28: qty 5

## 2021-02-28 MED ORDER — ONDANSETRON HCL 4 MG/2ML IJ SOLN
INTRAMUSCULAR | Status: DC | PRN
  Administered 2021-02-28: 4 mg via INTRAVENOUS

## 2021-02-28 MED ORDER — PHENYLEPHRINE 40 MCG/ML (10ML) SYRINGE FOR IV PUSH (FOR BLOOD PRESSURE SUPPORT)
PREFILLED_SYRINGE | INTRAVENOUS | Status: AC
  Filled 2021-02-28: qty 10

## 2021-02-28 MED ORDER — BUPIVACAINE-EPINEPHRINE 0.25% -1:200000 IJ SOLN
INTRAMUSCULAR | Status: DC | PRN
  Administered 2021-02-28: 30 mL

## 2021-02-28 MED ORDER — PROPOFOL 10 MG/ML IV BOLUS
INTRAVENOUS | Status: AC
  Filled 2021-02-28: qty 20

## 2021-02-28 MED ORDER — ONDANSETRON HCL 4 MG/2ML IJ SOLN: 4.0000 mg | Freq: Once | INTRAMUSCULAR | Status: DC | PRN

## 2021-02-28 MED ORDER — PROPOFOL 10 MG/ML IV BOLUS
INTRAVENOUS | Status: DC | PRN
  Administered 2021-02-28: 150 mg via INTRAVENOUS

## 2021-02-28 MED ORDER — LIDOCAINE 2% (20 MG/ML) 5 ML SYRINGE
INTRAMUSCULAR | Status: DC | PRN
  Administered 2021-02-28: 80 mg via INTRAVENOUS

## 2021-02-28 MED ORDER — FENTANYL CITRATE (PF) 250 MCG/5ML IJ SOLN
INTRAMUSCULAR | Status: DC | PRN
  Administered 2021-02-28 (×2): 50 ug via INTRAVENOUS

## 2021-02-28 MED ORDER — CHLORHEXIDINE GLUCONATE 0.12 % MT SOLN
OROMUCOSAL | Status: AC
  Administered 2021-02-28: 15 mL via OROMUCOSAL
  Filled 2021-02-28: qty 15

## 2021-02-28 MED ORDER — CHLORHEXIDINE GLUCONATE 0.12 % MT SOLN: 15.0000 mL | Freq: Once | OROMUCOSAL | Status: AC

## 2021-02-28 MED ORDER — LACTATED RINGERS IV SOLN: INTRAVENOUS | Status: DC

## 2021-02-28 MED ORDER — PHENYLEPHRINE HCL-NACL 10-0.9 MG/250ML-% IV SOLN
INTRAVENOUS | Status: DC | PRN
  Administered 2021-02-28: 50 ug/min via INTRAVENOUS

## 2021-02-28 MED ORDER — ROCURONIUM BROMIDE 100 MG/10ML IV SOLN
INTRAVENOUS | Status: DC | PRN
  Administered 2021-02-28: 70 mg via INTRAVENOUS

## 2021-02-28 MED ORDER — OXYCODONE HCL 5 MG PO TABS: 5.0000 mg | ORAL_TABLET | Freq: Once | ORAL | Status: DC | PRN

## 2021-02-28 MED ORDER — PHENYLEPHRINE HCL-NACL 10-0.9 MG/250ML-% IV SOLN
INTRAVENOUS | Status: AC
  Filled 2021-02-28: qty 750

## 2021-02-28 MED ORDER — CEFAZOLIN SODIUM-DEXTROSE 2-4 GM/100ML-% IV SOLN
2.0000 g | INTRAVENOUS | Status: AC
  Administered 2021-02-28: 2 g via INTRAVENOUS

## 2021-02-28 MED ORDER — ORAL CARE MOUTH RINSE: 15.0000 mL | Freq: Once | OROMUCOSAL | Status: AC

## 2021-02-28 MED ORDER — EPINEPHRINE PF 1 MG/ML IJ SOLN
INTRAMUSCULAR | Status: DC | PRN
  Administered 2021-02-28: 1 mg

## 2021-02-28 MED ORDER — EPINEPHRINE PF 1 MG/ML IJ SOLN
INTRAMUSCULAR | Status: AC
  Filled 2021-02-28: qty 1

## 2021-02-28 MED ORDER — OXYCODONE HCL 5 MG/5ML PO SOLN: 5.0000 mg | Freq: Once | ORAL | Status: DC | PRN

## 2021-02-28 MED ORDER — SUGAMMADEX SODIUM 200 MG/2ML IV SOLN
INTRAVENOUS | Status: DC | PRN
  Administered 2021-02-28: 600 mg via INTRAVENOUS

## 2021-02-28 MED ORDER — MIDAZOLAM HCL 2 MG/2ML IJ SOLN
INTRAMUSCULAR | Status: AC
  Filled 2021-02-28: qty 2

## 2021-02-28 MED ORDER — CEFAZOLIN SODIUM-DEXTROSE 2-4 GM/100ML-% IV SOLN
INTRAVENOUS | Status: AC
  Filled 2021-02-28: qty 100

## 2021-02-28 MED ORDER — 0.9 % SODIUM CHLORIDE (POUR BTL) OPTIME
TOPICAL | Status: DC | PRN
  Administered 2021-02-28: 1000 mL

## 2021-02-28 MED ORDER — FENTANYL CITRATE (PF) 100 MCG/2ML IJ SOLN: 25.0000 ug | INTRAMUSCULAR | Status: DC | PRN

## 2021-02-28 MED ORDER — PHENYLEPHRINE HCL (PRESSORS) 10 MG/ML IV SOLN
INTRAVENOUS | Status: DC | PRN
  Administered 2021-02-28 (×2): 80 ug via INTRAVENOUS

## 2021-02-28 MED ORDER — SODIUM CHLORIDE (PF) 0.9 % IJ SOLN
INTRAMUSCULAR | Status: DC | PRN
  Administered 2021-02-28: 500 mL

## 2021-02-28 MED ORDER — BUPIVACAINE-EPINEPHRINE (PF) 0.25% -1:200000 IJ SOLN
INTRAMUSCULAR | Status: AC
  Filled 2021-02-28: qty 30

## 2021-02-28 MED ORDER — MIDAZOLAM HCL 2 MG/2ML IJ SOLN
INTRAMUSCULAR | Status: DC | PRN
  Administered 2021-02-28: 1 mg via INTRAVENOUS

## 2021-02-28 SURGICAL SUPPLY — 46 items
ADH SKN CLS APL DERMABOND .7 (GAUZE/BANDAGES/DRESSINGS)
BAG COUNTER SPONGE SURGICOUNT (BAG) ×3 IMPLANT
BAG SPNG CNTER NS LX DISP (BAG) ×2
BLADE CLIPPER SURG (BLADE) IMPLANT
BLADE SURG 15 STRL LF DISP TIS (BLADE) ×2 IMPLANT
BLADE SURG 15 STRL SS (BLADE) ×3
BNDG ADH 1X3 SHEER STRL LF (GAUZE/BANDAGES/DRESSINGS) ×1 IMPLANT
BNDG ADH THN 3X1 STRL LF (GAUZE/BANDAGES/DRESSINGS) ×2
CANISTER SUCT 3000ML PPV (MISCELLANEOUS) IMPLANT
DECANTER SPIKE VIAL GLASS SM (MISCELLANEOUS) ×3 IMPLANT
DERMABOND ADVANCED (GAUZE/BANDAGES/DRESSINGS)
DERMABOND ADVANCED .7 DNX12 (GAUZE/BANDAGES/DRESSINGS) IMPLANT
DRAPE LAPAROTOMY 100X72 PEDS (DRAPES) IMPLANT
DRAPE U-SHAPE 76X120 STRL (DRAPES) IMPLANT
DRSG PAD ABDOMINAL 8X10 ST (GAUZE/BANDAGES/DRESSINGS) ×1 IMPLANT
DRSG TEGADERM 2-3/8X2-3/4 SM (GAUZE/BANDAGES/DRESSINGS) IMPLANT
ELECT CAUTERY BLADE 6.4 (BLADE) IMPLANT
ELECT COATED BLADE 2.86 ST (ELECTRODE) IMPLANT
ELECT NDL BLADE 2-5/6 (NEEDLE) IMPLANT
ELECT NEEDLE BLADE 2-5/6 (NEEDLE) IMPLANT
ELECT REM PT RETURN 9FT ADLT (ELECTROSURGICAL) ×3
ELECTRODE REM PT RTRN 9FT ADLT (ELECTROSURGICAL) ×2 IMPLANT
GAUZE SPONGE 4X4 12PLY STRL (GAUZE/BANDAGES/DRESSINGS) ×1 IMPLANT
GAUZE SPONGE 4X4 12PLY STRL LF (GAUZE/BANDAGES/DRESSINGS) ×3 IMPLANT
GLOVE SURG ENC MOIS LTX SZ6.5 (GLOVE) ×6 IMPLANT
GLOVE SURG ENC MOIS LTX SZ7 (GLOVE) ×6 IMPLANT
GLOVE SURG ENC MOIS LTX SZ7.5 (GLOVE) ×6 IMPLANT
GOWN STRL REUS W/ TWL LRG LVL3 (GOWN DISPOSABLE) ×6 IMPLANT
GOWN STRL REUS W/TWL LRG LVL3 (GOWN DISPOSABLE) ×9
KIT BASIN OR (CUSTOM PROCEDURE TRAY) ×3 IMPLANT
NDL HYPO 25GX1X1/2 BEV (NEEDLE) ×2 IMPLANT
NEEDLE HYPO 25GX1X1/2 BEV (NEEDLE) ×3 IMPLANT
NS IRRIG 1000ML POUR BTL (IV SOLUTION) ×3 IMPLANT
PACK SURGICAL SETUP 50X90 (CUSTOM PROCEDURE TRAY) ×3 IMPLANT
PENCIL SMOKE EVACUATOR (MISCELLANEOUS) ×3 IMPLANT
STRIP CLOSURE SKIN 1/2X4 (GAUZE/BANDAGES/DRESSINGS) ×1 IMPLANT
SUT MON AB 4-0 PC3 18 (SUTURE) ×3 IMPLANT
SUT MON AB 5-0 P3 18 (SUTURE) ×1 IMPLANT
SUT PDS AB 3-0 SH 27 (SUTURE) ×1 IMPLANT
SUT PLAIN GUT FAST 5-0 (SUTURE) ×3 IMPLANT
SUT VLOC 90 P-14 23 (SUTURE) ×1 IMPLANT
SYR BULB EAR ULCER 3OZ GRN STR (SYRINGE) ×3 IMPLANT
SYR CONTROL 10ML LL (SYRINGE) ×3 IMPLANT
TOWEL GREEN STERILE FF (TOWEL DISPOSABLE) ×3 IMPLANT
TUBE CONNECTING 12X1/4 (SUCTIONS) ×3 IMPLANT
YANKAUER SUCT BULB TIP NO VENT (SUCTIONS) ×3 IMPLANT

## 2021-02-28 NOTE — Anesthesia Postprocedure Evaluation (Signed)
Anesthesia Post Note  Patient: Jesse Petersen  Procedure(s) Performed: Excision left lower back mass (Left: Back) Excision central forehead mass (Face)     Patient location during evaluation: PACU Anesthesia Type: General Level of consciousness: awake Pain management: pain level controlled Vital Signs Assessment: post-procedure vital signs reviewed and stable Respiratory status: spontaneous breathing and respiratory function stable Cardiovascular status: stable Postop Assessment: no apparent nausea or vomiting Anesthetic complications: no   No notable events documented.  Last Vitals:  Vitals:   02/28/21 1315 02/28/21 1326  BP: (!) 172/90 (!) 180/92  Pulse:    Resp:    Temp:    SpO2: 100%     Last Pain:  Vitals:   02/28/21 1315  TempSrc:   PainSc: 0-No pain                 Mellody Dance

## 2021-02-28 NOTE — Interval H&P Note (Signed)
Patient seen and examined. Risks and benefits discussed. Proceed with surgery.

## 2021-02-28 NOTE — Anesthesia Procedure Notes (Signed)
Procedure Name: Intubation Date/Time: 02/28/2021 11:51 AM Performed by: Elenore Paddy, CRNA Pre-anesthesia Checklist: Patient identified, Emergency Drugs available, Suction available and Patient being monitored Oxygen Delivery Method: Circle system utilized Preoxygenation: Pre-oxygenation with 100% oxygen Induction Type: IV induction Ventilation: Mask ventilation without difficulty Laryngoscope Size: Mac and 4 Grade View: Grade I Tube size: 7.5 mm Number of attempts: 1 Airway Equipment and Method: Stylet Placement Confirmation: ETT inserted through vocal cords under direct vision, positive ETCO2 and breath sounds checked- equal and bilateral Secured at: 21 cm Tube secured with: Tape

## 2021-02-28 NOTE — Op Note (Signed)
Operative Note   DATE OF OPERATION: 02/28/2021  SURGICAL DEPARTMENT: Plastic Surgery  PREOPERATIVE DIAGNOSES: 1.  Left back mass 2.  Forehead mass  POSTOPERATIVE DIAGNOSES:  same  PROCEDURE: 1.  Excision of left back mass totaling 7 cm in size with complex closure 2.  Excision of forehead mass totaling 2 cm in size with complex closure  SURGEON: Ancil Linsey, MD  ASSISTANT: Enedina Finner, RNFA The advanced practice practitioner (APP) assisted throughout the case.  The APP was essential in retraction and counter traction when needed to make the case progress smoothly.  This retraction and assistance made it possible to see the tissue plans for the procedure.  The assistance was needed for blood control, tissue re-approximation and assisted with closure of the incision site.  ANESTHESIA:  General.   COMPLICATIONS: None.   INDICATIONS FOR PROCEDURE:  The patient, Jesse Petersen is a 75 y.o. male born on [birth year], is here for treatment of forehead and back masses MRN: [id number]  CONSENT:  Informed consent was obtained directly from the patient. Risks, benefits and alternatives were fully discussed. Specific risks including but not limited to bleeding, infection, hematoma, seroma, scarring, pain, contracture, asymmetry, wound healing problems, and need for further surgery were all discussed. The patient did have an ample opportunity to have questions answered to satisfaction.   DESCRIPTION OF PROCEDURE:  The patient was taken to the operating room. SCDs were placed and antibiotics were given.  General anesthesia was administered.  The patient's operative site was prepped and draped in a sterile fashion. A time out was performed and all information was confirmed to be correct.  Both areas were marked out.  Both areas were infiltrated with tumescent solution to help with pain control and hemostasis.  I started with the left back.  An elliptical incision was made with a 15 blade.  I  dissected down to the mass sharply and excised with a combination of sharp and cautery dissection.  It appeared to be a sebaceous cyst.  Hemostasis was obtained and then the surrounding skin was undermined and advanced and closed in layers with interrupted buried 3-0 PDS sutures in a running 3 OV lock.  I inter my attention of the forehead.  Elliptical incision was made with a 15 blade and I dissected down to the mass which also appeared to be a sebaceous cyst.  It was excised with sharp dissection.  Hemostasis was obtained with cautery.  Surrounding skin was then undermined and advanced and closed in layers with interrupted buried 5-0 Monocryl sutures and a running 5-0 plain gut.  Soft dressings were applied.  The patient tolerated the procedure well.  There were no complications. The patient was allowed to wake from anesthesia, extubated and taken to the recovery room in satisfactory condition.

## 2021-02-28 NOTE — Brief Op Note (Signed)
02/28/2021  12:54 PM  PATIENT:  Jesse Petersen  75 y.o. male  PRE-OPERATIVE DIAGNOSIS:  LEFT LOWER BVACK MASS, CENTRAL FOREHEAD MASS  POST-OPERATIVE DIAGNOSIS:  LEFT LOWER BVACK MASS, CENTRAL FOREHEAD MASS  PROCEDURE:  Procedure(s): Excision left lower back mass (Left) Excision central forehead mass (N/A)  SURGEON:  Surgeon(s) and Role:    * Houa Ackert, Wendy Poet, MD - Primary  PHYSICIAN ASSISTANT: Enedina Finner, RNFA  ASSISTANTS: none   ANESTHESIA:   general  EBL:  10 mL   BLOOD ADMINISTERED:none  DRAINS: none   LOCAL MEDICATIONS USED:  MARCAINE     SPECIMEN:  Source of Specimen:  left back and forehead masses  DISPOSITION OF SPECIMEN:  PATHOLOGY  COUNTS:  YES  TOURNIQUET:  * No tourniquets in log *  DICTATION: .Dragon Dictation  PLAN OF CARE: PACU  PATIENT DISPOSITION:  PACU - hemodynamically stable.   Delay start of Pharmacological VTE agent (>24hrs) due to surgical blood loss or risk of bleeding: not applicable

## 2021-02-28 NOTE — Interval H&P Note (Signed)
History and Physical Interval Note:  02/28/2021 11:03 AM  Jesse Petersen  has presented today for surgery, with the diagnosis of Sebaceous cyst.  The various methods of treatment have been discussed with the patient and family. After consideration of risks, benefits and other options for treatment, the patient has consented to  Procedure(s): Excision left lower back mass (Left) Excision central forehead mass (N/A) as a surgical intervention.  The patient's history has been reviewed, patient examined, no change in status, stable for surgery.  I have reviewed the patient's chart and labs.  Questions were answered to the patient's satisfaction.     Jesse Petersen

## 2021-02-28 NOTE — Transfer of Care (Signed)
Immediate Anesthesia Transfer of Care Note  Patient: Jesse Petersen  Procedure(s) Performed: Excision left lower back mass (Left: Back) Excision central forehead mass (Face)  Patient Location: PACU  Anesthesia Type:General  Level of Consciousness: awake, alert  and patient cooperative  Airway & Oxygen Therapy: Patient Spontanous Breathing and Patient connected to face mask oxygen  Post-op Assessment: Report given to RN, Post -op Vital signs reviewed and stable and Patient moving all extremities X 4  Post vital signs: Reviewed and stable  Last Vitals:  Vitals Value Taken Time  BP    Temp    Pulse    Resp    SpO2      Last Pain:  Vitals:   02/28/21 0858  TempSrc:   PainSc: 0-No pain         Complications: No notable events documented.

## 2021-02-28 NOTE — Discharge Instructions (Signed)
Activity: As tolerated, but avoid strenuous activity until follow up visit.  Diet: Regular  Wound Care: Keep dressing clean & dry for 2 days.  After that you can shower normally.  Redress the wound as needed for comfort.  Special Instructions:  Call our office if any unusual problems occur such as pain, excessive bleeding, unrelieved nausea/vomiting, fever &/or chills.  Follow-up appointment: Scheduled for next week.  

## 2021-03-01 ENCOUNTER — Encounter (HOSPITAL_COMMUNITY): Payer: Self-pay | Admitting: Plastic Surgery

## 2021-03-01 LAB — SURGICAL PATHOLOGY

## 2021-03-11 ENCOUNTER — Ambulatory Visit (INDEPENDENT_AMBULATORY_CARE_PROVIDER_SITE_OTHER): Payer: Medicare Other | Admitting: Surgical

## 2021-03-11 ENCOUNTER — Encounter: Payer: Self-pay | Admitting: Surgical

## 2021-03-11 ENCOUNTER — Other Ambulatory Visit: Payer: Self-pay

## 2021-03-11 DIAGNOSIS — L723 Sebaceous cyst: Secondary | ICD-10-CM

## 2021-03-11 NOTE — Progress Notes (Signed)
Patient is a 75 year old male here for follow-up after excision of left back mass and excision of forehead mass with Dr. Arita Miss on 02/28/2021.  11 days postop.  Patient reports that overall he is doing really well, does not report any pain.  No other concerns.  He has some questions about restrictions  On exam left back incision is intact and healing nicely.  No wounds are noted.  No subcutaneous fluid collection or hematoma noted with palpation.  No overlying skin changes noted.  Nontender with palpation.  Forehead incision is intact and healing nicely, no wounds noted.  Fast gut suture noted.  No erythema noted.  Discussed with patient that sutures will dissolve from forehead incision over the next week or so.  He can continue with washing this area.  We did discuss restrictions of no lifting greater than 15 pounds for the next few weeks to avoid increased risk of complications/wounds to the left back incision.  All of his questions were answered to his content.  Recommend calling with questions or concerns, following up as needed.

## 2021-03-17 ENCOUNTER — Encounter: Payer: Medicare Other | Admitting: Plastic Surgery

## 2023-11-07 ENCOUNTER — Encounter: Payer: Self-pay | Admitting: Cardiology

## 2023-11-07 ENCOUNTER — Ambulatory Visit: Attending: Cardiology | Admitting: Cardiology

## 2023-11-07 VITALS — BP 160/78 | HR 82 | Ht 74.5 in | Wt 195.8 lb

## 2023-11-07 DIAGNOSIS — E1169 Type 2 diabetes mellitus with other specified complication: Secondary | ICD-10-CM | POA: Diagnosis not present

## 2023-11-07 DIAGNOSIS — M940 Chondrocostal junction syndrome [Tietze]: Secondary | ICD-10-CM | POA: Diagnosis not present

## 2023-11-07 DIAGNOSIS — I2510 Atherosclerotic heart disease of native coronary artery without angina pectoris: Secondary | ICD-10-CM | POA: Diagnosis not present

## 2023-11-07 DIAGNOSIS — E785 Hyperlipidemia, unspecified: Secondary | ICD-10-CM | POA: Diagnosis not present

## 2023-11-07 DIAGNOSIS — I10 Essential (primary) hypertension: Secondary | ICD-10-CM

## 2023-11-07 DIAGNOSIS — I358 Other nonrheumatic aortic valve disorders: Secondary | ICD-10-CM | POA: Insufficient documentation

## 2023-11-07 MED ORDER — PRAVASTATIN SODIUM 20 MG PO TABS: 20.0000 mg | ORAL_TABLET | Freq: Every day | ORAL | 3 refills | Status: DC

## 2023-11-07 MED ORDER — NITROGLYCERIN 0.4 MG SL SUBL: 0.4000 mg | SUBLINGUAL_TABLET | SUBLINGUAL | 3 refills | Status: AC | PRN

## 2023-11-07 NOTE — Assessment & Plan Note (Addendum)
 Chest Pain is reproducible on exam with palpation along the costosternal border. Intermittent chest pain likely due to costochondritis, not cardiac in origin. Suggest using analgesics for therapy.

## 2023-11-07 NOTE — Patient Instructions (Signed)
 Medication Instructions:   No changes with current medications  *If you need a refill on your cardiac medications before your next appointment, please call your pharmacy*  Other Instructions  Please keep a log of your blood pressure at random times of the day , and the week prior to next appointment take record your blood pressures twice a day morning and Evening and bring all information to your appointment.  Lab Work:  Not needed   Testing/Procedures:  Not needed  Follow-Up: At [facility], you and your health needs are our priority.  As part of our continuing mission to provide you with exceptional heart care, we have created designated Provider Care Teams.  These Care Teams include your primary Cardiologist (physician) and Advanced Practice Providers (APPs -  Physician Assistants and Nurse Practitioners) who all work together to provide you with the care you need, when you need it.     Your next appointment:   2 month(s)  The format for your next appointment:   In Person  Provider:   Bryan Lemma, MD  or Edd Fabian, FNP, Marjie Skiff, PA-C, Bernadene Person, NP, or Reather Littler, NP       Other Instructions

## 2023-11-07 NOTE — Progress Notes (Addendum)
 Cardiology Office Note:  .   Date:  11/07/2023  ID:  Jesse Petersen, DOB 02-05-1946, MRN [id number] PCP: Ralene Ok, MD  [facility] Providers Cardiologist:  Bryan Lemma, MD     Chief Complaint  Patient presents with   Follow-up    Patient Profile: .     Jesse Petersen is a relatively healthy appearing 78 y.o. male  with a PMH notable for CAD/NSTEMI (06/2012 - no PCI), HTN,, HLD, DM-2 (diet-controlled) &~CKD3a who presents here for very delayed follow-up at the request of Ralene Ok, MD.   NSTEMI 06/2012:  Cath - 99% small PLB (too small for PCI), OM2 40-50% ->  Med Rx DAPT x 1 yr & continued on ASA, statin & beta Blocker Echo 06/2012: EF 60-65%, mild LVH. No RWMA Lexiscan Cardiolite ST 05/2013 (to assess DOE & atypical CP): EF 61%, No ischemia or infarction. Fixed small defects in distal inferior & apical walls c/w soft tissue attenuation.   Jesse Petersen was last seen in July 2022 as a preop evaluation for a lipoma excision.  At that time he was able to achieve at least 9-10 METS of activity without any symptoms.  Walking at least a mile or 2 to 6 days a week and other calisthenics.  Most limited by knee pain.  No angina symptoms of chest pain or dyspnea at rest or exertion.  No PND, orthopnea or edema.  Lipids are not adequately controlled and BP was still elevated.  Plan was to readdress lipids in follow-up after his surgery, but he never returned.  He has BP seem to be pretty well-controlled at home but was elevated during the visit.  I suggested an ARB. => Unfortunately, he has not been seen since then for follow-up  Subjective   Discussed the use of AI scribe software for clinical note transcription with the patient, who gave verbal consent to proceed.  History of Present Illness   Jesse Petersen is a 78 year old male with a history of myocardial infarction who presents with intermittent chest pain.  He experiences intermittent chest pain described as a 'little pain'  that occurs 'once in a while' without specific triggers. The pain can occur while sitting or performing tasks like working on the floor and does not worsen with exertion, such as climbing stairs. No associated shortness of breath, palpitations, dizziness, or syncope. He also denies headaches, blurred vision, or sudden changes in vision.  He has a history of myocardial infarction in 2013 and underwent a chest test in 2014. Currently, he is on aspirin and pravastatin for cholesterol management. He recalls slightly elevated cholesterol levels during his last check-up in February, though exact numbers are unknown.  He monitors his blood pressure at home, which is usually normal, around 135/78 mmHg, but tends to be elevated during office visits. He takes telmisartan and amlodipine in the morning. He reports swelling in his legs, particularly in one leg, which improves with elevation. He uses a bed that allows him to elevate his legs, which helps reduce the swelling. No issues with laying flat or waking up short of breath.  He is retired and spends his time working on his house. He does not smoke and does not have diabetes.         Objective   Cardiac Medications - Aspirin 81 mg daily - Pravastatin 20 mg daily - Telmisartan-- Amlodipine 40-10 mg daily  Studies Reviewed: Marland Kitchen   EKG Interpretation Date/Time:  Wednesday November 07 2023 08:24:16 EDT Ventricular  Rate:  82 PR Interval:  198 QRS Duration:  90 QT Interval:  336 QTC Calculation: 392 R Axis:   51  Text Interpretation: Normal sinus rhythm Right atrial enlargement When compared with ECG of 04-Oct-2017 10:38, Vent. rate has increased BY  28 BPM Questionable change in QRS axis Confirmed by Bryan Lemma ([id number]) on 11/07/2023 8:41:30 AM    No new labs  11/08/2020 Na+ 140, K+ 5.0, Cl- 103, HCO3- 26 , BUN 26, Cr 1.61*(GFR 48), Glu 93, Ca2+ 9.7; AST 24, ALT 29, AlkP 123 CBC: W 7.0, H/H 13.7/40.7, Plt 178 TC 187, TG 99, HDL 58, LDL 109  Risk  Assessment/Calculations:     HYPERTENSION CONTROL Vitals:   11/07/23 0830 11/07/23 0832 11/07/23 0901  BP: (!) 168/86 (!) 164/82 (!) 160/78    The patient's blood pressure is elevated above target today.  In order to address the patient's elevated BP: Blood pressure will be monitored at home to determine if medication changes need to be made.; The blood pressure is usually elevated in clinic.  Blood pressures monitored at home have been optimal. (BPs are usually better controlled at home-was 135/78 this morning. => Monitor with BP log leading up to 5-month follow-up at which time we can reassess BP management.  Would require adding additional medicine.)          Physical Exam:   VS:  BP (!) 160/78   Pulse 82   Ht 6' 2.5" (1.892 m)   Wt 195 lb 12.8 oz (88.8 kg)   SpO2 99%   BMI 24.80 kg/m    Wt Readings from Last 3 Encounters:  11/07/23 195 lb 12.8 oz (88.8 kg)  02/28/21 217 lb (98.4 kg)  02/16/21 214 lb 9.6 oz (97.3 kg)    GEN: Well nourished, well developed in no acute distress; healthy appearing NECK: No JVD; No carotid bruits CARDIAC: RRR, Normal S1, S2; Sopft SEM @ RUSB, otw No murmurs, rubs, gallops; very localized pinpoint tenderness along the left costosternal border ribs 3 and 4 which correlates with his chest pain RESPIRATORY:  Clear to auscultation without rales, wheezing or rhonchi ; nonlabored, good air movement. ABDOMEN: Soft, non-tender, non-distended EXTREMITIES:  No edema; No deformity     ASSESSMENT AND PLAN: .    Problem List Items Addressed This Visit       Cardiology Problems   Aortic valve sclerosis   Mild calcification with soft ejection murmur, echocardiogram considered to assess progression. - Consider echocardiogram to evaluate aortic valve function.      Relevant Medications   pravastatin (PRAVACHOL) 20 MG tablet   nitroGLYCERIN (NITROSTAT) 0.4 MG SL tablet   Coronary artery disease involving native coronary artery without angina pectoris -  Primary (Chronic)   Relevant Medications   pravastatin (PRAVACHOL) 20 MG tablet   nitroGLYCERIN (NITROSTAT) 0.4 MG SL tablet   Other Relevant Orders   EKG 12-Lead (Completed)   Essential hypertension (Chronic)   Blood pressure elevated in office, controlled at home. Goal is <135/85. Possible need for additional medication.=> Continue telmisartan-amlodipine 40-10 mg daily - Maintain blood pressure log for 2-3 weeks. - Reassess blood pressure in 1-2 months. - Consider adding thiazide diuretic if blood pressure remains elevated.      Relevant Medications   pravastatin (PRAVACHOL) 20 MG tablet   nitroGLYCERIN (NITROSTAT) 0.4 MG SL tablet   Other Relevant Orders   EKG 12-Lead (Completed)   Hyperlipidemia with target low density lipoprotein (LDL) cholesterol less than 70 mg/dL (Chronic)   On  pravastatin 20 Daily, cholesterol levels unknown.  Goal LDL <70, ideally <55. Pravastatin dose adequacy under review. - Obtain cholesterol levels from primary care provider. - Refill pravastatin prescription. - Reassess cholesterol levels and adjust pravastatin dose if necessary.      Relevant Medications   pravastatin (PRAVACHOL) 20 MG tablet   nitroGLYCERIN (NITROSTAT) 0.4 MG SL tablet   Other Relevant Orders   EKG 12-Lead (Completed)     Other   Costochondritis (Chronic)   Chest Pain is reproducible on exam with palpation along the costosternal border. Intermittent chest pain likely due to costochondritis, not cardiac in origin. Suggest using analgesics for therapy.           Follow-Up: Return in about 2 months (around 01/07/2024) for [employer], Routine follow up with me. Recording duration: 25 minutes Total time spent: 25 minutes spent with patient + 16 min spent charting = 41 min  I spent 41 minutes in the care of Jesse Petersen today including reviewing outside labs from PCP via [facility] (1 minute), reviewing studies (3 minutes), face to face time discussing  treatment options (25 minutes), reviewing records from previous clinic notes (2 minutes), 9 minutes dictating, and documenting in the encounter.  Addendum: Labs from PCP dated 09/24/2023: CBC W5.6, H/H14.0/42.1, PLT 172.  Lipids-TC 204, TG 112, HDL 59, LDL 123; chemistry BUN/CR 40/1.02 (GFR 45.  K+ 4.7.  Normal LFTs.  A1c 5.8    Signed, Marykay Lex, MD, MS Bryan Lemma, M.D., M.S. Interventional Cardiologist  [facility]  Pager # [phone] Phone # [phone] [address] [place], [place] [address]

## 2023-11-07 NOTE — Assessment & Plan Note (Signed)
 Mild calcification with soft ejection murmur, echocardiogram considered to assess progression. - Consider echocardiogram to evaluate aortic valve function.

## 2023-11-07 NOTE — Assessment & Plan Note (Signed)
 Blood pressure elevated in office, controlled at home. Goal is <135/85. Possible need for additional medication.=> Continue telmisartan-amlodipine 40-10 mg daily - Maintain blood pressure log for 2-3 weeks. - Reassess blood pressure in 1-2 months. - Consider adding thiazide diuretic if blood pressure remains elevated.

## 2023-11-07 NOTE — Assessment & Plan Note (Addendum)
 On pravastatin 20 Daily, cholesterol levels unknown.  Goal LDL <70, ideally <55. Pravastatin dose adequacy under review. - Obtain cholesterol levels from primary care provider. - Refill pravastatin prescription. - Reassess cholesterol levels and adjust pravastatin dose if necessary.

## 2024-01-11 ENCOUNTER — Ambulatory Visit: Admitting: Cardiology

## 2024-01-14 ENCOUNTER — Ambulatory Visit: Attending: Cardiology | Admitting: Cardiology

## 2024-01-14 ENCOUNTER — Other Ambulatory Visit: Payer: Self-pay | Admitting: *Deleted

## 2024-01-14 ENCOUNTER — Encounter: Payer: Self-pay | Admitting: Cardiology

## 2024-01-14 VITALS — BP 128/76 | HR 77 | Ht 72.25 in | Wt 193.0 lb

## 2024-01-14 DIAGNOSIS — I358 Other nonrheumatic aortic valve disorders: Secondary | ICD-10-CM | POA: Diagnosis not present

## 2024-01-14 DIAGNOSIS — I10 Essential (primary) hypertension: Secondary | ICD-10-CM | POA: Diagnosis not present

## 2024-01-14 DIAGNOSIS — E785 Hyperlipidemia, unspecified: Secondary | ICD-10-CM

## 2024-01-14 DIAGNOSIS — I2510 Atherosclerotic heart disease of native coronary artery without angina pectoris: Secondary | ICD-10-CM

## 2024-01-14 MED ORDER — ROSUVASTATIN CALCIUM 40 MG PO TABS: 40.0000 mg | ORAL_TABLET | Freq: Every day | ORAL | 3 refills | Status: AC

## 2024-01-14 NOTE — Progress Notes (Unsigned)
 Cardiology Office Note:  .   Date:  01/16/2024  ID:  Jesse Petersen, DOB [birth year], MRN [id number] PCP: Edda Goo, MD  [facility] Providers Cardiologist:  Randene Bustard, MD     Chief Complaint  Patient presents with   Follow-up    Close follow-up to reassess blood sugar   Coronary Artery Disease    No angina; lipids not at goal.    Patient Profile: .     Jesse Petersen is a  relatively healthy appearing 78 y.o. male with a PMH noted below who presents here for 2 month f/u to reassess BP.    PMH notable for CAD/NSTEMI (06/2012 - no PCI), HTN,, HLD, DM-2 (diet-controlled) &~CKD3a  He was las seen in April 2025 for very delayed follow-up at the request of Edda Goo, MD.   NSTEMI 06/2012:  Cath - 99% small PLB (too small for PCI), OM2 40-50% ->  Med Rx DAPT x 1 yr & continued on ASA, statin & beta Blocker (06/2012) Echo 06/2012: EF 60-65%, mild LVH. No RWMA Lexiscan  Cardiolite  ST  (to assess DOE & atypical CP): EF 61%, No ischemia or infarction. Fixed small defects in distal inferior & apical walls c/w soft tissue attenuation. (05/2013     Jesse Petersen was last seen on November 07, 2023 for reconsultation noting intermittent chest pain and labile blood pressure.  Chest pain was reproducible on palpation.  More consistent with costochondritis.  Was noted to have elevated blood pressure.  Plan for close follow-up to reassess pressures.  I asked that he maintain a blood pressure log.  Subjective  Discussed the use of AI scribe software for clinical note transcription with the patient, who gave verbal consent to proceed.  History of Present Illness History of Present Illness Jesse Petersen is a 78 year old male with hypertension and coronary artery disease who presents for a follow-up on his blood pressure and heart murmur.  His blood pressure readings at home are typically in the 120s, occasionally reaching the 130s, but during medical visits, his blood pressure tends to be  higher, such as 147/70 during this visit. He takes telmisartan 40 mg and amlodipine  10 mg in the morning, along with aspirin  81 mg and pravastatin  20 mg. No headaches, blurred vision, heart racing, chest pain, or shortness of breath. He occasionally experiences lightheadedness upon waking, which resolves after a while.  He recalls a fall while helping a neighbor, resulting in a twisted ankle, but denies any loss of consciousness or stroke-like symptoms. He mentions occasional swelling in his right ankle, which he attributes to the fall.  He has a history of a heart murmur, first noted by a previous physician, and is aware of a family history of heart murmurs, as his mother had one. He recalls a mild heart attack in 2013, which was the last time his heart was evaluated. He is concerned about the murmur, noting that it 'kind of comes and goes'.  His cholesterol levels were recently checked, with an LDL of 123. He has not been on any other cholesterol medications besides pravastatin .     Objective  Medications - Aspirin  81 mg - Pravastatin  20 mg - Telmisartan - Amlodipine  40-10 mg  -Allopurinol  120 daily; colchicine  0.6 mg twice daily; gabapentin  600 mg twice daily; Zofran  4 mg as needed; prednisone  10 mg PRN flare   Studies Reviewed: Aaron Aas        Labs from PCP dated 09/24/2023: CBC W5.6, H/H14.0/42.1, PLT 172.  Lipids-TC 204, TG  112, HDL 59, LDL 123; chemistry BUN/CR 16/1.09 (GFR 45.  K+ 4.7.  Normal LFTs.  A1c 5.8   Risk Assessment/Calculations:            Physical Exam:   VS:  BP 128/76   Pulse 77   Ht 6' 0.25 (1.835 m)   Wt 193 lb (87.5 kg)   SpO2 97%   BMI 25.99 kg/m    Wt Readings from Last 3 Encounters:  01/14/24 193 lb (87.5 kg)  11/07/23 195 lb 12.8 oz (88.8 kg)  02/28/21 217 lb (98.4 kg)    GEN: Well nourished, well groomed  in no acute distress; healthy appearing NECK: No JVD; No carotid bruits CARDIAC: RRR, Normal S1& S2; no rubs, gallops; Soft SEM @ RUSB RESPIRATORY:   Clear to auscultation without rales, wheezing or rhonchi ; nonlabored, good air movement. ABDOMEN: Soft, non-tender, non-distended EXTREMITIES:  No edema; No deformity      ASSESSMENT AND PLAN: .    Problem List Items Addressed This Visit       Cardiology Problems   Aortic valve sclerosis (Chronic)   Intermittent systolic murmur possibly due to aortic sclerosis. Further evaluation needed to rule out significant valvular disease. - Order 2D echocardiogram to assess cause and severity of murmur.      Relevant Medications   rosuvastatin  (CRESTOR ) 40 MG tablet   Other Relevant Orders   ECHOCARDIOGRAM COMPLETE   Coronary artery disease involving native coronary artery without angina pectoris - Primary (Chronic)   Coronary artery disease with history of myocardial infarction LDL cholesterol elevated at 123 mg/dL, exceeding target for secondary prevention. Current pravastatin  insufficient. - Switch pravastatin  to rosuvastatin  40 mg daily, and continue combination telmisartan-amlodipine  (40-10 mg daily) along with aspirin  81 mg daily - Recheck cholesterol levels in 2-3 months.       Relevant Medications   rosuvastatin  (CRESTOR ) 40 MG tablet   Other Relevant Orders   ECHOCARDIOGRAM COMPLETE   Lipid panel   Essential hypertension (Chronic)   Blood pressure well-controlled at home, elevated in clinical settings likely due to white coat syndrome. Occasional morning lightheadedness likely due to dehydration. - Continue telmisartan-amlodipine .  (40-10 mg) - Advise to drink a glass of water upon waking.      Relevant Medications   rosuvastatin  (CRESTOR ) 40 MG tablet   Hyperlipidemia with target low density lipoprotein (LDL) cholesterol less than 70 mg/dL (Chronic)   LDL cholesterol elevated at 123 mg/dL, exceeding target for secondary prevention. Current pravastatin  insufficient. - Switch pravastatin  to rosuvastatin  40 mg daily,  - Recheck cholesterol levels in 2-3 months. - Discuss  potential side effects of rosuvastatin , including memory issues and myalgia, and advise to report any such symptoms. - Consider referral to lipid clinic if cholesterol levels do not improve.      Relevant Medications   rosuvastatin  (CRESTOR ) 40 MG tablet   Other Relevant Orders   Lipid panel        Follow-Up: Return in about 6 months (around 07/15/2024) for Alternate 6 month follow-up with APP & MD.     Signed, Arleen Lacer, MD, MS Randene Bustard, M.D., M.S. Interventional Chartered certified accountant  Pager # [phone]

## 2024-01-14 NOTE — Patient Instructions (Addendum)
 Medication Instructions:   Stop pravastatin   Start taking Rosuvastatin  40  mg daily   *If you need a refill on your cardiac medications before your next appointment, please call your pharmacy*   Lab Work: Lipids in 2-3 months  -fasting    Testing/Procedures:  Your physician has requested that you have an echocardiogram. Echocardiography is a painless test that uses sound waves to create images of your heart. It provides your doctor with information about the size and shape of your heart and how well your heart's chambers and valves are working. This procedure takes approximately one hour. There are no restrictions for this procedure. Please do NOT wear cologne, perfume, aftershave, or lotions (deodorant is allowed). Please arrive 15 minutes prior to your appointment time.  Please note: We ask at that you not bring children with you during ultrasound (echo/ vascular) testing. Due to room size and safety concerns, children are not allowed in the ultrasound rooms during exams. Our front office staff cannot provide observation of children in our lobby area while testing is being conducted. An adult accompanying a patient to their appointment will only be allowed in the ultrasound room at the discretion of the ultrasound technician under special circumstances. We apologize for any inconvenience.   Follow-Up: At [facility], you and your health needs are our priority.  As part of our continuing mission to provide you with exceptional heart care, we have created designated Provider Care Teams.  These Care Teams include your primary Cardiologist (physician) and Advanced Practice Providers (APPs -  Physician Assistants and Nurse Practitioners) who all work together to provide you with the care you need, when you need it.     Your next appointment:   6 month(s)  The format for your next appointment:   In Person  Provider:   Lawana Pray, NP, Ervin Heath, PA-C, or Leala Prince, PA-C       Then, Randene Bustard, MD will plan to see you again in 12 month(s).   Other Instructions

## 2024-01-16 ENCOUNTER — Encounter: Payer: Self-pay | Admitting: Cardiology

## 2024-01-16 NOTE — Assessment & Plan Note (Addendum)
 Coronary artery disease with history of myocardial infarction LDL cholesterol elevated at 123 mg/dL, exceeding target for secondary prevention. Current pravastatin  insufficient. - Switch pravastatin  to rosuvastatin  40 mg daily, and continue combination telmisartan-amlodipine  (40-10 mg daily) along with aspirin  81 mg daily - Recheck cholesterol levels in 2-3 months.

## 2024-01-16 NOTE — Assessment & Plan Note (Signed)
 LDL cholesterol elevated at 123 mg/dL, exceeding target for secondary prevention. Current pravastatin  insufficient. - Switch pravastatin  to rosuvastatin  40 mg daily,  - Recheck cholesterol levels in 2-3 months. - Discuss potential side effects of rosuvastatin , including memory issues and myalgia, and advise to report any such symptoms. - Consider referral to lipid clinic if cholesterol levels do not improve.

## 2024-01-16 NOTE — Assessment & Plan Note (Signed)
 Intermittent systolic murmur possibly due to aortic sclerosis. Further evaluation needed to rule out significant valvular disease. - Order 2D echocardiogram to assess cause and severity of murmur.

## 2024-01-16 NOTE — Assessment & Plan Note (Signed)
 Blood pressure well-controlled at home, elevated in clinical settings likely due to white coat syndrome. Occasional morning lightheadedness likely due to dehydration. - Continue telmisartan-amlodipine .  (40-10 mg) - Advise to drink a glass of water upon waking.

## 2024-02-26 ENCOUNTER — Telehealth (HOSPITAL_COMMUNITY): Payer: Self-pay | Admitting: Cardiology

## 2024-02-26 NOTE — Telephone Encounter (Signed)
 Patient cancelled echocardiogram for reason below:    02/26/2024 1:01 PM Ab:TPODNW, JASMIN B  Cancel Rsn: Patient (stated he will be going out of town and will call back to reschedule echocardiogram.  Order will be removed from the echo WQ. When patient calls back we will reinstate the order . Thank you.

## 2024-02-27 ENCOUNTER — Other Ambulatory Visit (HOSPITAL_COMMUNITY)

## 2024-08-28 ENCOUNTER — Emergency Department (HOSPITAL_BASED_OUTPATIENT_CLINIC_OR_DEPARTMENT_OTHER)

## 2024-08-28 ENCOUNTER — Other Ambulatory Visit (HOSPITAL_BASED_OUTPATIENT_CLINIC_OR_DEPARTMENT_OTHER): Payer: Self-pay

## 2024-08-28 ENCOUNTER — Emergency Department (HOSPITAL_BASED_OUTPATIENT_CLINIC_OR_DEPARTMENT_OTHER)
Admission: EM | Admit: 2024-08-28 | Discharge: 2024-08-28 | Disposition: A | Discharge status: Home / Self Care | Attending: Emergency Medicine | Admitting: Emergency Medicine

## 2024-08-28 ENCOUNTER — Encounter (HOSPITAL_BASED_OUTPATIENT_CLINIC_OR_DEPARTMENT_OTHER): Payer: Self-pay

## 2024-08-28 ENCOUNTER — Other Ambulatory Visit: Payer: Self-pay

## 2024-08-28 DIAGNOSIS — W000XXA Fall on same level due to ice and snow, initial encounter: Secondary | ICD-10-CM | POA: Diagnosis not present

## 2024-08-28 DIAGNOSIS — Z7982 Long term (current) use of aspirin: Secondary | ICD-10-CM | POA: Insufficient documentation

## 2024-08-28 DIAGNOSIS — M62838 Other muscle spasm: Secondary | ICD-10-CM | POA: Insufficient documentation

## 2024-08-28 DIAGNOSIS — M545 Low back pain, unspecified: Secondary | ICD-10-CM | POA: Diagnosis not present

## 2024-08-28 DIAGNOSIS — S43401A Unspecified sprain of right shoulder joint, initial encounter: Secondary | ICD-10-CM | POA: Diagnosis not present

## 2024-08-28 DIAGNOSIS — S4991XA Unspecified injury of right shoulder and upper arm, initial encounter: Secondary | ICD-10-CM | POA: Diagnosis present

## 2024-08-28 MED ORDER — CYCLOBENZAPRINE HCL 10 MG PO TABS
10.0000 mg | ORAL_TABLET | Freq: Two times a day (BID) | ORAL | 0 refills | Status: AC | PRN
  Filled 2024-08-28: qty 20, 10d supply, fill #0

## 2024-08-28 NOTE — ED Triage Notes (Addendum)
 Pt fell Tuesday on ice. Landed on back complaining of right shoulder and left lower back pain. Denies LOC and thinners.

## 2024-08-28 NOTE — ED Provider Notes (Signed)
 " [facility] Provider Note   CSN: [id number] Arrival date & time: 08/28/24  1007     Patient presents with: Jesse Petersen is a 79 y.o. male.   Patient here right shoulder pain low back pain after fall couple days ago and ice.  Did not hit his head or lose consciousness.  Not on blood thinners.  Has been ambulatory with a fall but having some stiffness in his low back.  Having pain in the right shoulder when he moves.  Denies any pain elsewhere.  The history is provided by the patient.       Prior to Admission medications  Medication Sig Start Date End Date Taking? Authorizing Provider  cyclobenzaprine  (FLEXERIL ) 10 MG tablet Take 1 tablet (10 mg total) by mouth 2 (two) times daily as needed for muscle spasms. 08/28/24  Yes Suzzanne Brunkhorst, DO  allopurinol  (ZYLOPRIM ) 100 MG tablet Take 100 mg by mouth daily.    [provider]  aspirin  EC 81 MG tablet Take 81 mg by mouth daily.    [provider]  b complex vitamins capsule Take 1 capsule by mouth daily.    [provider]  Cholecalciferol (VITAMIN D) 50 MCG (2000 UT) tablet Take 2,000 Units by mouth daily.    [provider]  colchicine  0.6 MG tablet Take 0.6 mg by mouth 2 (two) times daily.    [provider]  fluticasone (FLONASE) 50 MCG/ACT nasal spray Place 1 spray into both nostrils daily as needed for allergies or rhinitis.    [provider]  gabapentin  (NEURONTIN ) 300 MG capsule Take 600 mg by mouth 2 (two) times daily.    [provider]  Multiple Vitamin (MULTIVITAMIN WITH MINERALS) TABS tablet Take 1 tablet by mouth daily.    [provider]  nitroGLYCERIN  (NITROSTAT ) 0.4 MG SL tablet Place 1 tablet (0.4 mg total) under the tongue every 5 (five) minutes as needed for chest pain. 11/07/23   Anner Alm ORN, MD  ondansetron  (ZOFRAN ) 4 MG tablet Take 1 tablet (4 mg total) by mouth every 8 (eight) hours as  needed for nausea or vomiting. 02/24/21   Scheeler, Donnice PARAS, PA-C  predniSONE  (DELTASONE ) 10 MG tablet Take 10 mg by mouth as needed. 10/25/23   [provider]  rosuvastatin  (CRESTOR ) 40 MG tablet Take 1 tablet (40 mg total) by mouth daily. 01/14/24 04/13/24  Anner Alm ORN, MD  Telmisartan-amLODIPine  40-10 MG TABS Take 1 tablet by mouth daily. 12/08/20   [provider]    Allergies: Patient has no known allergies.    Review of Systems  Updated Vital Signs BP (!) 183/94 (BP Location: Left Arm)   Pulse 81   Temp 97.9 F (36.6 C)   Resp 16   Wt 87.1 kg   SpO2 100%   BMI 25.86 kg/m   Physical Exam Vitals and nursing note reviewed.  Constitutional:      General: He is not in acute distress.    Appearance: He is well-developed.  HENT:     Head: Normocephalic and atraumatic.  Eyes:     Extraocular Movements: Extraocular movements intact.     Conjunctiva/sclera: Conjunctivae normal.     Pupils: Pupils are equal, round, and reactive to light.  Cardiovascular:     Rate and Rhythm: Normal rate and regular rhythm.     Pulses: Normal pulses.     Heart sounds: No murmur heard. Pulmonary:  Effort: Pulmonary effort is normal. No respiratory distress.     Breath sounds: Normal breath sounds.  Abdominal:     Palpations: Abdomen is soft.     Tenderness: There is no abdominal tenderness.  Musculoskeletal:        General: Tenderness present. No swelling.     Cervical back: Normal range of motion and neck supple.     Comments: Tenderness to the left lower back, no midline spinal tenderness, tenderness to the right shoulder but good range of motion but with discomfort  Skin:    General: Skin is warm and dry.     Capillary Refill: Capillary refill takes less than 2 seconds.  Neurological:     General: No focal deficit present.     Mental Status: He is alert and oriented to person, place, and time.     Cranial Nerves: No cranial nerve deficit.     Sensory: No  sensory deficit.     Motor: No weakness.     Coordination: Coordination normal.  Psychiatric:        Mood and Affect: Mood normal.     (all labs ordered are listed, but only abnormal results are displayed) Labs Reviewed - No data to display  EKG: None  Radiology: DG Lumbar Spine Complete Result Date: 08/28/2024 CLINICAL DATA:  Fall on ice with low back pain. EXAM: LUMBAR SPINE - COMPLETE 4+ VIEW COMPARISON:  None Available. FINDINGS: No acute fracture or subluxation identified. 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 No bony lesions or destruction. Calcified plaque visible in the abdominal aorta. IMPRESSION: 1. No acute findings. 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 2. Aortic atherosclerosis. Electronically Signed   By: Marcey Moan M.D.   On: 08/28/2024 12:11   DG Shoulder Right Result Date: 08/28/2024 EXAM: 1 VIEW(S) XRAY OF THE RIGHT SHOULDER 08/28/2024 11:20:00 AM COMPARISON: None available. CLINICAL HISTORY: Fall. FINDINGS: BONES AND JOINTS: Glenohumeral joint is normally aligned. No acute fracture. No malalignment. 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 SOFT TISSUES: No abnormal calcifications. Visualized lung is unremarkable. IMPRESSION: 1. No acute traumatic injury. 2. 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 Electronically signed by: Ryan Salvage MD 08/28/2024 12:06 PM EST RP Workstation: [id number]     Procedures   Medications Ordered in the ED - No data to display                                  Medical Decision Making Amount and/or Complexity of Data Reviewed Radiology: ordered.  Risk Prescription drug management.   Jesse Petersen is here with right shoulder pain and left lower back pain after fall on ice couple days ago.  Did not hit his head or lose consciousness.  Not on blood thinners.   Differential diagnosis likely shoulder sprain/contusion versus less likely fracture.  I suspect back spasms and unlikely compression fracture.  Will get x-ray of the right shoulder and low back.  He is neurovascular neuromuscular intact on exam.  Neurologically intact.  He is very well-appearing.  Does have history of chronic kidney disease CAD.  Will give a dose of muscle relaxant here.  X-rays show no acute fracture or malalignment per my review interpretation.  Some arthritis changes.  Will place in a sling for comfort.  Medications written for home.  Continue rest hydration and follow-up with orthopedics.  Discharge.  I suspect shoulder sprain muscle spasm.  This chart was dictated using voice recognition software.  Despite best efforts to proofread,  errors can occur which can change the documentation meaning.      Final diagnoses:  Sprain of right shoulder, unspecified shoulder sprain type, initial encounter  Acute low back pain, unspecified back pain laterality, unspecified whether sciatica present  Muscle spasm    ED Discharge Orders          Ordered    cyclobenzaprine  (FLEXERIL ) 10 MG tablet  2 times daily PRN        08/28/24 1046               Caddie Randle, Juliene, DO 08/28/24 1215  "

## 2024-08-28 NOTE — Discharge Instructions (Addendum)
 X-rays are normal.  There is no fractures.  You do have some arthritis changes.  Follow-up with primary care and orthopedics.  Wear sling for comfort.  Recommend ice 20 minutes on several times a day.  Consider using Tylenol  for pain as well.  Use sling for comfort.  Follow-up with orthopedics for your right shoulder pain.  Follow-up with primary care otherwise.  I have prescribed you a muscle relaxant to take as needed as well.  This medication is sedating so please be careful with this use.

## 2024-08-28 NOTE — ED Notes (Signed)
 Reviewed discharge instructions ,follow up and medications with pt. Instructed to wear splint for comfort, but patient had removed due to being uncomfortable. Pt ambulatory at discharge with daughter

## 2024-08-29 ENCOUNTER — Other Ambulatory Visit (HOSPITAL_BASED_OUTPATIENT_CLINIC_OR_DEPARTMENT_OTHER): Payer: Self-pay

## 2024-08-29 ENCOUNTER — Telehealth (HOSPITAL_BASED_OUTPATIENT_CLINIC_OR_DEPARTMENT_OTHER): Payer: Self-pay

## 2024-08-29 NOTE — Telephone Encounter (Signed)
 Pt states that he was seen in the ED for a fall an was prescribed flexeril . When he got his meds at home he noted a different drug and name on meds. Instructed pt to call pharmacy

## 2024-09-01 ENCOUNTER — Other Ambulatory Visit (HOSPITAL_BASED_OUTPATIENT_CLINIC_OR_DEPARTMENT_OTHER): Payer: Self-pay
